# Patient Record
Sex: Female | Born: 1955
Health system: Southern US, Community
[De-identification: ages and names within clinical notes are randomized; demographics above are authoritative.]

## PROBLEM LIST (undated history)

## (undated) DIAGNOSIS — M1990 Unspecified osteoarthritis, unspecified site: Secondary | ICD-10-CM

## (undated) HISTORY — PX: NO PAST SURGERIES: SHX2092

---

## 1997-12-30 ENCOUNTER — Ambulatory Visit (HOSPITAL_COMMUNITY): Admission: RE | Admit: 1997-12-30 | Discharge: 1997-12-30 | Payer: Self-pay | Admitting: Family Medicine

## 1998-10-15 ENCOUNTER — Other Ambulatory Visit: Admission: RE | Admit: 1998-10-15 | Discharge: 1998-10-15 | Payer: Self-pay | Admitting: Family Medicine

## 2001-04-29 ENCOUNTER — Emergency Department (HOSPITAL_COMMUNITY): Admission: EM | Admit: 2001-04-29 | Discharge: 2001-04-29 | Payer: Self-pay | Admitting: Podiatry

## 2001-04-29 ENCOUNTER — Encounter: Payer: Self-pay | Admitting: *Deleted

## 2002-01-16 ENCOUNTER — Ambulatory Visit (HOSPITAL_COMMUNITY): Admission: RE | Admit: 2002-01-16 | Discharge: 2002-01-16 | Payer: Self-pay | Admitting: Ophthalmology

## 2008-07-10 ENCOUNTER — Emergency Department (HOSPITAL_COMMUNITY): Admission: EM | Admit: 2008-07-10 | Discharge: 2008-07-10 | Payer: Self-pay | Admitting: Family Medicine

## 2009-04-14 ENCOUNTER — Emergency Department (HOSPITAL_COMMUNITY): Admission: EM | Admit: 2009-04-14 | Discharge: 2009-04-14 | Payer: Self-pay | Admitting: Family Medicine

## 2010-07-24 ENCOUNTER — Emergency Department (HOSPITAL_COMMUNITY)
Admission: EM | Admit: 2010-07-24 | Discharge: 2010-07-24 | Disposition: A | Discharge status: Other Acute Inpatient Hospital | Payer: Self-pay | Source: Home / Self Care | Admitting: Family Medicine

## 2010-07-24 ENCOUNTER — Emergency Department (HOSPITAL_COMMUNITY)
Admission: EM | Admit: 2010-07-24 | Discharge: 2010-07-25 | Payer: Self-pay | Source: Home / Self Care | Admitting: Emergency Medicine

## 2010-08-03 LAB — URINALYSIS, ROUTINE W REFLEX MICROSCOPIC
Ketones, ur: 15 mg/dL — AB
Leukocytes, UA: NEGATIVE
Nitrite: NEGATIVE
Protein, ur: 30 mg/dL — AB
Specific Gravity, Urine: 1.031 — ABNORMAL HIGH (ref 1.005–1.030)
Urine Glucose, Fasting: NEGATIVE mg/dL
Urobilinogen, UA: 1 mg/dL (ref 0.0–1.0)
pH: 5.5 (ref 5.0–8.0)

## 2010-08-03 LAB — URINE MICROSCOPIC-ADD ON

## 2010-10-23 LAB — POCT RAPID STREP A (OFFICE): Streptococcus, Group A Screen (Direct): NEGATIVE

## 2010-11-13 ENCOUNTER — Other Ambulatory Visit: Payer: Self-pay | Admitting: Gynecology

## 2010-11-13 ENCOUNTER — Encounter (INDEPENDENT_AMBULATORY_CARE_PROVIDER_SITE_OTHER): Payer: BC Managed Care – PPO | Admitting: Gynecology

## 2010-11-13 ENCOUNTER — Other Ambulatory Visit (HOSPITAL_COMMUNITY)
Admission: RE | Admit: 2010-11-13 | Discharge: 2010-11-13 | Disposition: A | Discharge status: Home / Self Care | Payer: BC Managed Care – PPO | Source: Ambulatory Visit | Attending: Gynecology | Admitting: Gynecology

## 2010-11-13 DIAGNOSIS — R17 Unspecified jaundice: Secondary | ICD-10-CM

## 2010-11-13 DIAGNOSIS — Z124 Encounter for screening for malignant neoplasm of cervix: Secondary | ICD-10-CM | POA: Insufficient documentation

## 2010-11-13 DIAGNOSIS — Z01419 Encounter for gynecological examination (general) (routine) without abnormal findings: Secondary | ICD-10-CM

## 2010-11-13 DIAGNOSIS — Z13220 Encounter for screening for lipoid disorders: Secondary | ICD-10-CM

## 2010-11-16 ENCOUNTER — Other Ambulatory Visit: Payer: Self-pay | Admitting: Gynecology

## 2010-11-16 DIAGNOSIS — Z1231 Encounter for screening mammogram for malignant neoplasm of breast: Secondary | ICD-10-CM

## 2010-12-04 ENCOUNTER — Ambulatory Visit
Admission: RE | Admit: 2010-12-04 | Discharge: 2010-12-04 | Disposition: A | Discharge status: Home / Self Care | Payer: BC Managed Care – PPO | Source: Ambulatory Visit | Attending: Gynecology | Admitting: Gynecology

## 2010-12-04 DIAGNOSIS — Z1231 Encounter for screening mammogram for malignant neoplasm of breast: Secondary | ICD-10-CM

## 2012-01-14 ENCOUNTER — Ambulatory Visit (INDEPENDENT_AMBULATORY_CARE_PROVIDER_SITE_OTHER): Payer: BC Managed Care – PPO | Admitting: Emergency Medicine

## 2012-01-14 VITALS — BP 128/83 | HR 72 | Temp 98.4°F | Resp 18 | Ht <= 58 in | Wt 116.0 lb

## 2012-01-14 DIAGNOSIS — A088 Other specified intestinal infections: Secondary | ICD-10-CM

## 2012-01-14 MED ORDER — LOPERAMIDE HCL 2 MG PO TABS: 2.0000 mg | ORAL_TABLET | Freq: Four times a day (QID) | ORAL | Status: AC | PRN

## 2012-01-14 MED ORDER — ONDANSETRON 4 MG PO TBDP: 4.0000 mg | ORAL_TABLET | Freq: Three times a day (TID) | ORAL | Status: AC | PRN

## 2012-01-14 NOTE — Patient Instructions (Signed)
Viral Gastroenteritis Viral gastroenteritis is also known as stomach flu. This condition affects the stomach and intestinal tract. It can cause sudden diarrhea and vomiting. The illness typically lasts 3 to 8 days. Most people develop an immune response that eventually gets rid of the virus. While this natural response develops, the virus can make you quite ill. CAUSES  Many different viruses can cause gastroenteritis, such as rotavirus or noroviruses. You can catch one of these viruses by consuming contaminated food or water. You may also catch a virus by sharing utensils or other personal items with an infected person or by touching a contaminated surface. SYMPTOMS  The most common symptoms are diarrhea and vomiting. These problems can cause a severe loss of body fluids (dehydration) and a body salt (electrolyte) imbalance. Other symptoms may include:  Fever.   Headache.   Fatigue.   Abdominal pain.  DIAGNOSIS  Your caregiver can usually diagnose viral gastroenteritis based on your symptoms and a physical exam. A stool sample may also be taken to test for the presence of viruses or other infections. TREATMENT  This illness typically goes away on its own. Treatments are aimed at rehydration. The most serious cases of viral gastroenteritis involve vomiting so severely that you are not able to keep fluids down. In these cases, fluids must be given through an intravenous line (IV). HOME CARE INSTRUCTIONS   Drink enough fluids to keep your urine clear or pale yellow. Drink small amounts of fluids frequently and increase the amounts as tolerated.   Ask your caregiver for specific rehydration instructions.   Avoid:   Foods high in sugar.   Alcohol.   Carbonated drinks.   Tobacco.   Juice.   Caffeine drinks.   Extremely hot or cold fluids.   Fatty, greasy foods.   Too much intake of anything at one time.   Dairy products until 24 to 48 hours after diarrhea stops.   You may  consume probiotics. Probiotics are active cultures of beneficial bacteria. They may lessen the amount and number of diarrheal stools in adults. Probiotics can be found in yogurt with active cultures and in supplements.   Wash your hands well to avoid spreading the virus.   Only take over-the-counter or prescription medicines for pain, discomfort, or fever as directed by your caregiver. Do not give aspirin to children. Antidiarrheal medicines are not recommended.   Ask your caregiver if you should continue to take your regular prescribed and over-the-counter medicines.   Keep all follow-up appointments as directed by your caregiver.  SEEK IMMEDIATE MEDICAL CARE IF:   You are unable to keep fluids down.   You do not urinate at least once every 6 to 8 hours.   You develop shortness of breath.   You notice blood in your stool or vomit. This may look like coffee grounds.   You have abdominal pain that increases or is concentrated in one small area (localized).   You have persistent vomiting or diarrhea.   You have a fever.   The patient is a child younger than 3 months, and he or she has a fever.   The patient is a child older than 3 months, and he or she has a fever and persistent symptoms.   The patient is a child older than 3 months, and he or she has a fever and symptoms suddenly get worse.   The patient is a baby, and he or she has no tears when crying.  MAKE SURE YOU:     Understand these instructions.   Will watch your condition.   Will get help right away if you are not doing well or get worse.  Document Released: 07/05/2005 Document Revised: 06/24/2011 Document Reviewed: 04/21/2011 [employer] Patient Information 2012 [employer]. 

## 2012-01-14 NOTE — Progress Notes (Signed)
  Subjective:    Patient ID: Mandy Williams, female    DOB: 12/15/1955, 56 y.o.   MRN: [id number]  Diarrhea  This is a new problem. The current episode started yesterday. The problem occurs 5 to 10 times per day. The problem has been unchanged. The stool consistency is described as watery. The patient states that diarrhea does not awaken her from sleep. Associated symptoms include chills, a fever and vomiting. Pertinent negatives include no abdominal pain, arthralgias, bloating, coughing, headaches, increased  flatus, myalgias, sweats, URI or weight loss. Risk factors include no known risk factors. She has tried nothing for the symptoms. There is no history of bowel resection, inflammatory bowel disease, irritable bowel syndrome, malabsorption, a recent abdominal surgery or short gut syndrome.      Review of Systems  Constitutional: Positive for fever and chills. Negative for weight loss.  HENT: Negative.   Eyes: Negative.   Respiratory: Negative.  Negative for cough.   Cardiovascular: Negative.   Gastrointestinal: Positive for nausea, vomiting and diarrhea. Negative for abdominal pain, abdominal distention, bloating and flatus.  Genitourinary: Negative.   Musculoskeletal: Negative.  Negative for myalgias and arthralgias.  Skin: Negative.   Neurological: Negative for headaches.       Objective:   Physical Exam  Constitutional: She is oriented to person, place, and time. She appears well-developed and well-nourished.  HENT:  Head: Normocephalic and atraumatic.  Right Ear: External ear normal.  Left Ear: External ear normal.  Eyes: Conjunctivae are normal. Pupils are equal, round, and reactive to light. No scleral icterus.  Neck: Normal range of motion. Neck supple.  Cardiovascular: Normal rate, regular rhythm, normal heart sounds and intact distal pulses.   Pulmonary/Chest: Effort normal and breath sounds normal.  Abdominal: Soft.  Musculoskeletal: Normal range of motion.  Neurological:  She is alert and oriented to person, place, and time.  Skin: Skin is warm and dry.          Assessment & Plan:  Imodium, zofran  Clear liquids   Tylenol for fever  Follow up or ER for new or worsened symptoms

## 2012-04-18 ENCOUNTER — Ambulatory Visit: Payer: BC Managed Care – PPO

## 2012-04-18 ENCOUNTER — Ambulatory Visit (INDEPENDENT_AMBULATORY_CARE_PROVIDER_SITE_OTHER): Payer: BC Managed Care – PPO | Admitting: Family Medicine

## 2012-04-18 VITALS — BP 144/80 | HR 72 | Temp 97.8°F | Resp 16 | Ht <= 58 in | Wt 115.0 lb

## 2012-04-18 DIAGNOSIS — M79643 Pain in unspecified hand: Secondary | ICD-10-CM

## 2012-04-18 DIAGNOSIS — M7710 Lateral epicondylitis, unspecified elbow: Secondary | ICD-10-CM

## 2012-04-18 DIAGNOSIS — M549 Dorsalgia, unspecified: Secondary | ICD-10-CM

## 2012-04-18 MED ORDER — DICLOFENAC SODIUM 75 MG PO TBEC: 75.0000 mg | DELAYED_RELEASE_TABLET | Freq: Two times a day (BID) | ORAL | Status: DC

## 2012-04-18 NOTE — Progress Notes (Signed)
Mandy Williams is a 56 y.o. female who presents to [facility] today for bilateral hand pain present for several days without injury. Patient has pain at the left second MCP and right fourth PIP. Additionally pain in the right elbow. No change in work. Patient works in a Development worker, community with frequent repetitive hand activity. She feels well otherwise without any radiating pain weakness numbness loss of sensation. She has never had anything like this previously. She feels well otherwise.   PMH: Reviewed otherwise healthy History  Substance Use Topics  . Smoking status: Never Smoker   . Smokeless tobacco: Not on file  . Alcohol Use: Not on file   ROS as above  Medications reviewed. Current Outpatient Prescriptions  Medication Sig Dispense Refill  . diclofenac (VOLTAREN) 75 MG EC tablet Take 1 tablet (75 mg total) by mouth 2 (two) times daily.  60 tablet  0    Exam:  BP 144/80  Pulse 72  Temp 97.8 F (36.6 C) (Oral)  Resp 16  Ht 4\' 7"  (1.397 m)  Wt 115 lb (52.164 kg)  BMI 26.73 kg/m2 Gen: Well NAD LEFT HAND: Normal-appearing. Mild synovitis of left second MCP. Reduced flexion range of motion normal extension and strength. Right hand normal-appearing mildly tender at the right fourth PIP. Normal range of motion and strength Sensation is intact throughout capillary refill is intact throughout pulses are 2+ at both wrists Right elbow: Normal range of motion normal. Mildly tender at right lateral epicondyles. Pain at right lateral epicondyle with resisted wrist extension  Three-view of right hand. Normal-appearing no bony abnormalities normal alignment Review of left hand: Normal-appearing no bony abnormalities normal alignment  Assessment and Plan: 56 y.o. female with bilateral hand pain. No definitive diagnosis tonight possibly synovitis secondary to overuse. Additionally patient has right lateral epicondylitis.   Plan: Diclofenac twice daily as needed. Out of work x1-3 days. Return to clinic if  not improving. Patient expresses understanding.

## 2012-04-18 NOTE — Patient Instructions (Addendum)
Thank you for coming in today. Take the medicine twice a day for one week. Out of work for 1-3 days as needed Come back if not better

## 2012-06-05 ENCOUNTER — Encounter: Payer: Self-pay | Admitting: Family Medicine

## 2012-06-05 ENCOUNTER — Ambulatory Visit: Payer: BC Managed Care – PPO

## 2012-06-05 ENCOUNTER — Ambulatory Visit (INDEPENDENT_AMBULATORY_CARE_PROVIDER_SITE_OTHER): Payer: BC Managed Care – PPO | Admitting: Family Medicine

## 2012-06-05 VITALS — BP 120/68 | HR 98 | Temp 98.0°F | Resp 16 | Ht <= 58 in | Wt 113.2 lb

## 2012-06-05 DIAGNOSIS — R0989 Other specified symptoms and signs involving the circulatory and respiratory systems: Secondary | ICD-10-CM

## 2012-06-05 DIAGNOSIS — J019 Acute sinusitis, unspecified: Secondary | ICD-10-CM

## 2012-06-05 DIAGNOSIS — J1100 Influenza due to unidentified influenza virus with unspecified type of pneumonia: Secondary | ICD-10-CM

## 2012-06-05 DIAGNOSIS — R6889 Other general symptoms and signs: Secondary | ICD-10-CM

## 2012-06-05 DIAGNOSIS — R05 Cough: Secondary | ICD-10-CM

## 2012-06-05 DIAGNOSIS — R069 Unspecified abnormalities of breathing: Secondary | ICD-10-CM

## 2012-06-05 DIAGNOSIS — R059 Cough, unspecified: Secondary | ICD-10-CM

## 2012-06-05 LAB — POCT INFLUENZA A/B
Influenza A, POC: NEGATIVE
Influenza B, POC: NEGATIVE

## 2012-06-05 MED ORDER — AZITHROMYCIN 250 MG PO TABS: ORAL_TABLET | ORAL | Status: DC

## 2012-06-05 MED ORDER — HYDROCODONE-HOMATROPINE 5-1.5 MG/5ML PO SYRP: 5.0000 mL | ORAL_SOLUTION | Freq: Every evening | ORAL | Status: DC | PRN

## 2012-06-05 MED ORDER — BENZONATATE 100 MG PO CAPS: 200.0000 mg | ORAL_CAPSULE | Freq: Two times a day (BID) | ORAL | Status: AC | PRN

## 2012-06-05 NOTE — Progress Notes (Signed)
  [facility]:  Office Visit  Chief Complaint:  Chief Complaint  Patient presents with  . Sore Throat    nasal congestion   . Cough    productive symptoms since Saturday    HPI: Mandy Williams is a 56 y.o. female who complains of  3 day history of generalized muscle aches, subjective fevers, cough, green sputum production. No sick contacts, has not had the flu vaccine, no sick contacts. + Chest congestion and pressure with deep breaths. Language barrier due to speaks [place] dialect.  History reviewed. No pertinent past medical history. History reviewed. No pertinent past surgical history. History   Social History  . Marital Status: Married    Spouse Name: N/A    Number of Children: N/A  . Years of Education: N/A   Social History Main Topics  . Smoking status: Never Smoker   . Smokeless tobacco: None  . Alcohol Use: No  . Drug Use: No  . Sexually Active: None   Other Topics Concern  . None   Social History Narrative  . None   No family history on file. No Known Allergies Prior to Admission medications   Medication Sig Start Date End Date Taking? Authorizing Provider  diclofenac (VOLTAREN) 75 MG EC tablet Take 1 tablet (75 mg total) by mouth 2 (two) times daily. 04/18/12   Rodolph Bong, MD     ROS: The patient denies fevers, chills, night sweats, unintentional weight loss,  palpitations, wheezing, dyspnea on exertion, nausea, vomiting, abdominal pain, dysuria, hematuria, melena, numbness, weakness, or tingling.   All other systems have been reviewed and were otherwise negative with the exception of those mentioned in the HPI and as above.    PHYSICAL EXAM: Filed Vitals:   06/05/12 1151  BP: 120/68  Pulse: 98  Temp: 98 F (36.7 C)  Resp: 16   Filed Vitals:   06/05/12 1151  Height: 4\' 7"  (1.397 m)  Weight: 113 lb 3.2 oz (51.347 kg)   Body mass index is 26.31 kg/(m^2).  General: Alert, no acute distress HEENT:  Normocephalic, atraumatic,  oropharynx patent. EOMI, PERRLA, TM nl. + Sinus tendernesss Cardiovascular:  Regular rate and rhythm, no rubs murmurs or gallops.  No Carotid bruits, radial pulse intact. No pedal edema.  Respiratory: Clear to auscultation bilaterally.  No wheezes, rales, or rhonchi.  No cyanosis, no use of accessory musculature GI: No organomegaly, abdomen is soft and non-tender, positive bowel sounds.  No masses. Skin: No rashes. Neurologic: Facial musculature symmetric. Psychiatric: Patient is appropriate throughout our interaction. Lymphatic: No cervical lymphadenopathy Musculoskeletal: Gait intact.   LABS: Results for orders placed in visit on 06/05/12  POCT INFLUENZA A/B      Component Value Range   Influenza A, POC Negative     Influenza B, POC Negative       EKG/XRAY:   Primary read interpreted by Dr. Conley Rolls at [facility] No acute cardiopulmonary process   ASSESSMENT/PLAN: Encounter Diagnoses  Name Primary?  . Flu-like symptoms Yes  . Chest congestion   . Cough   . Acute sinusitis    Rx cough meds, tessalon perles and hydromet, vicks vapor rub, z pack prn if sxs do not improve. Work note given for 11/18-11/19. Return to work on 06/07/12 F/u prn   Mandy Angus PHUONG, DO 06/05/2012 3:28 PM

## 2012-06-05 NOTE — Patient Instructions (Signed)

## 2012-12-25 ENCOUNTER — Ambulatory Visit (INDEPENDENT_AMBULATORY_CARE_PROVIDER_SITE_OTHER): Payer: BC Managed Care – PPO | Admitting: Family Medicine

## 2012-12-25 ENCOUNTER — Ambulatory Visit: Payer: BC Managed Care – PPO

## 2012-12-25 ENCOUNTER — Encounter: Payer: Self-pay | Admitting: Family Medicine

## 2012-12-25 VITALS — BP 146/85 | HR 86 | Temp 97.8°F | Resp 16 | Ht <= 58 in | Wt 114.0 lb

## 2012-12-25 DIAGNOSIS — R059 Cough, unspecified: Secondary | ICD-10-CM

## 2012-12-25 DIAGNOSIS — R05 Cough: Secondary | ICD-10-CM

## 2012-12-25 DIAGNOSIS — J209 Acute bronchitis, unspecified: Secondary | ICD-10-CM

## 2012-12-25 DIAGNOSIS — J019 Acute sinusitis, unspecified: Secondary | ICD-10-CM

## 2012-12-25 LAB — POCT CBC
Granulocyte percent: 60.5 %G (ref 37–80)
HCT, POC: 39 % (ref 37.7–47.9)
Hemoglobin: 11.7 g/dL — AB (ref 12.2–16.2)
Lymph, poc: 2.3 (ref 0.6–3.4)
MCH, POC: 22.3 pg — AB (ref 27–31.2)
MCHC: 30 g/dL — AB (ref 31.8–35.4)
MCV: 74.4 fL — AB (ref 80–97)
MID (cbc): 0.6 (ref 0–0.9)
MPV: 8.9 fL (ref 0–99.8)
POC Granulocyte: 4.5 (ref 2–6.9)
POC LYMPH PERCENT: 31.1 %L (ref 10–50)
POC MID %: 8.4 %M (ref 0–12)
Platelet Count, POC: 238 10*3/uL (ref 142–424)
RBC: 5.24 M/uL (ref 4.04–5.48)
RDW, POC: 15.1 %
WBC: 7.5 10*3/uL (ref 4.6–10.2)

## 2012-12-25 MED ORDER — PREDNISONE 20 MG PO TABS: ORAL_TABLET | ORAL | Status: DC

## 2012-12-25 MED ORDER — HYDROCODONE-HOMATROPINE 5-1.5 MG/5ML PO SYRP: 5.0000 mL | ORAL_SOLUTION | Freq: Every evening | ORAL | Status: DC | PRN

## 2012-12-25 MED ORDER — AZITHROMYCIN 250 MG PO TABS: ORAL_TABLET | ORAL | Status: DC

## 2012-12-25 NOTE — Patient Instructions (Addendum)
Return in 2 days for recheck.  No work until Thursday.

## 2012-12-25 NOTE — Progress Notes (Signed)
  Subjective:    Patient ID: Mandy Williams, female    DOB: 1955-08-12, 57 y.o.   MRN: [id number]  HPI 56 YO female patient comes in today with her daughter to translate. Patient complains of a headache, fever, sore throat, and a cough. The cough is productive with bloody, yellow mucus. She still feels like something is stuck inside her chest. She will cough until it is bloodier. She had the chills earlier today. She gets short of breath while coughing but otherwise she is not having any difficulty breathing. This has been going on since Saturday.   She does not have a history of asthma. She did have pneumonia in the past. She is not a smoker. 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   Review of Systems     Objective:   Physical Exam        Assessment & Plan:

## 2012-12-25 NOTE — Progress Notes (Signed)
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  She has had two days of cough, violent at times, with possible fever and hemoptysis. Non smoker, no asthma.  Positive h/o pneumonia  No night sweats, weight loss or neck swelling  Objective: NAD Chest:  Few ronchi bilaterally Heart:  Reg, no murmur Ext:  No edema Neck:  Supple, no adenopathy Oroph:  Clear with multiple missing teeth and receding gums Skin: warm and dry.l\  [facility] reading (PRIMARY) by  Dr. Milus Glazier CXR:  Hazy LLL with no silouette sign  Results for orders placed in visit on 12/25/12  POCT CBC      Result Value Range   WBC 7.5  4.6 - 10.2 K/uL   Lymph, poc 2.3  0.6 - 3.4   POC LYMPH PERCENT 31.1  10 - 50 %L   MID (cbc) 0.6  0 - 0.9   POC MID % 8.4  0 - 12 %M   POC Granulocyte 4.5  2 - 6.9   Granulocyte percent 60.5  37 - 80 %G   RBC 5.24  4.04 - 5.48 M/uL   Hemoglobin 11.7 (*) 12.2 - 16.2 g/dL   HCT, POC 40.9  81.1 - 47.9 %   MCV 74.4 (*) 80 - 97 fL   MCH, POC 22.3 (*) 27 - 31.2 pg   MCHC 30.0 (*) 31.8 - 35.4 g/dL   RDW, POC 91.4     Platelet Count, POC 238  142 - 424 K/uL   MPV 8.9  0 - 99.8 fL   Assessment:  Bronchitis with RAD  Cough - Plan: POCT CBC, DG Chest 2 View, azithromycin (ZITHROMAX) 250 MG tablet, HYDROcodone-homatropine (HYCODAN) 5-1.5 MG/5ML syrup, predniSONE (DELTASONE) 20 MG tablet  Acute sinusitis  Recheck in 48 hours.

## 2012-12-27 ENCOUNTER — Ambulatory Visit (INDEPENDENT_AMBULATORY_CARE_PROVIDER_SITE_OTHER): Payer: BC Managed Care – PPO | Admitting: Family Medicine

## 2012-12-27 VITALS — BP 118/66 | HR 78 | Temp 97.8°F | Resp 18 | Ht <= 58 in | Wt 113.4 lb

## 2012-12-27 DIAGNOSIS — R05 Cough: Secondary | ICD-10-CM

## 2012-12-27 DIAGNOSIS — J019 Acute sinusitis, unspecified: Secondary | ICD-10-CM

## 2012-12-27 DIAGNOSIS — R059 Cough, unspecified: Secondary | ICD-10-CM

## 2012-12-27 MED ORDER — BENZONATATE 100 MG PO CAPS: 200.0000 mg | ORAL_CAPSULE | Freq: Two times a day (BID) | ORAL | Status: DC | PRN

## 2012-12-27 MED ORDER — HYDROCODONE-HOMATROPINE 5-1.5 MG/5ML PO SYRP: 5.0000 mL | ORAL_SOLUTION | Freq: Every evening | ORAL | Status: DC | PRN

## 2012-12-27 MED ORDER — AMOXICILLIN-POT CLAVULANATE 875-125 MG PO TABS: 1.0000 | ORAL_TABLET | Freq: Two times a day (BID) | ORAL | Status: DC

## 2012-12-27 NOTE — Progress Notes (Signed)
[facility]:  Office Visit  Chief Complaint:  Chief Complaint  Patient presents with  . Follow-up    still having cough, fatigue     HPI: Mandy Williams is a 57 y.o. female who complains of  Here for f/u and not feeling better since evaluated for acute sinusitis. She has chills. She has cough.  The cough medicine did help, antibiotic did not help but makes her body itchy and also she feels nauseated and dizzy. Eating and drinking fair. She is here with her neighbor.  She has been at work. She was given predniosone, z pack and hydromet syrup. Last cxr was done on OV and showed no acute cardiopulmoanry process Nonsmoker, no allergies, works in Industrial/product designer   History reviewed. No pertinent past medical history. History reviewed. No pertinent past surgical history. History   Social History  . Marital Status: Married    Spouse Name: N/A    Number of Children: N/A  . Years of Education: N/A   Social History Main Topics  . Smoking status: Never Smoker   . Smokeless tobacco: None  . Alcohol Use: No  . Drug Use: No  . Sexually Active: None   Other Topics Concern  . None   Social History Narrative  . None   History reviewed. No pertinent family history. No Known Allergies Prior to Admission medications   Medication Sig Start Date End Date Taking? Authorizing Provider  azithromycin (ZITHROMAX) 250 MG tablet Take 2 tabs po now then 1 tab po daily for the next 4 days 12/25/12  Yes Elvina Sidle, MD  HYDROcodone-homatropine [facility]) 5-1.5 MG/5ML syrup Take 5 mLs by mouth at bedtime as needed for cough. 12/25/12  Yes Elvina Sidle, MD  predniSONE (DELTASONE) 20 MG tablet 2 daily with food 12/25/12  Yes Elvina Sidle, MD     ROS: The patient denies night sweats, unintentional weight loss, chest pain, palpitations, wheezing, dyspnea on exertion, nausea, vomiting, abdominal pain, dysuria, hematuria, melena, numbness, or tingling.   All other systems have been  reviewed and were otherwise negative with the exception of those mentioned in the HPI and as above.    PHYSICAL EXAM: Filed Vitals:   12/27/12 0918  BP: 118/66  Pulse: 78  Temp: 97.8 F (36.6 C)  Resp: 18   Filed Vitals:   12/27/12 0918  Height: 4\' 7"  (1.397 m)  Weight: 113 lb 6.4 oz (51.438 kg)  Spo2  98% Body mass index is 26.36 kg/(m^2).  General: Alert, no acute distress HEENT:  Normocephalic, atraumatic, oropharynx patent. Tm nl. No exudates. + sinus tenderness Cardiovascular:  Regular rate and rhythm, no rubs murmurs or gallops.  No Carotid bruits, radial pulse intact. No pedal edema.  Respiratory: Clear to auscultation bilaterally.  No wheezes, rales, or rhonchi.  No cyanosis, no use of accessory musculature GI: No organomegaly, abdomen is soft and non-tender, positive bowel sounds.  No masses. Skin: No rashes. Neurologic: Facial musculature symmetric. Psychiatric: Patient is appropriate throughout our interaction. Lymphatic: No cervical lymphadenopathy Musculoskeletal: Gait intact.   LABS: Results for orders placed in visit on 12/25/12  POCT CBC      Result Value Range   WBC 7.5  4.6 - 10.2 K/uL   Lymph, poc 2.3  0.6 - 3.4   POC LYMPH PERCENT 31.1  10 - 50 %L   MID (cbc) 0.6  0 - 0.9   POC MID % 8.4  0 - 12 %M   POC Granulocyte 4.5  2 -  6.9   Granulocyte percent 60.5  37 - 80 %G   RBC 5.24  4.04 - 5.48 M/uL   Hemoglobin 11.7 (*) 12.2 - 16.2 g/dL   HCT, POC 16.1  09.6 - 47.9 %   MCV 74.4 (*) 80 - 97 fL   MCH, POC 22.3 (*) 27 - 31.2 pg   MCHC 30.0 (*) 31.8 - 35.4 g/dL   RDW, POC 04.5     Platelet Count, POC 238  142 - 424 K/uL   MPV 8.9  0 - 99.8 fL     EKG/XRAY:   Primary read interpreted by Dr. Conley Rolls at [facility].   ASSESSMENT/PLAN: Encounter Diagnoses  Name Primary?  . Acute sinusitis Yes  . Cough    Will change her abx to Augmentin, Tessalon PErles, refill hydromet No xrays needed last one on 12/25/12 was normal, lungs are clear DC prednisone, Dc  Azithromycin F/u prn   Enis Riecke PHUONG, DO 12/28/2012 7:23 AM

## 2012-12-27 NOTE — Patient Instructions (Signed)
Sinusitis Sinusitis is redness, soreness, and swelling (inflammation) of the paranasal sinuses. Paranasal sinuses are air pockets within the bones of your face (beneath the eyes, the middle of the forehead, or above the eyes). In healthy paranasal sinuses, mucus is able to drain out, and air is able to circulate through them by way of your nose. However, when your paranasal sinuses are inflamed, mucus and air can become trapped. This can allow bacteria and other germs to grow and cause infection. Sinusitis can develop quickly and last only a short time (acute) or continue over a long period (chronic). Sinusitis that lasts for more than 12 weeks is considered chronic.  CAUSES  Causes of sinusitis include:  Allergies.  Structural abnormalities, such as displacement of the cartilage that separates your nostrils (deviated septum), which can decrease the air flow through your nose and sinuses and affect sinus drainage.  Functional abnormalities, such as when the small hairs (cilia) that line your sinuses and help remove mucus do not work properly or are not present. SYMPTOMS  Symptoms of acute and chronic sinusitis are the same. The primary symptoms are pain and pressure around the affected sinuses. Other symptoms include:  Upper toothache.  Earache.  Headache.  Bad breath.  Decreased sense of smell and taste.  A cough, which worsens when you are lying flat.  Fatigue.  Fever.  Thick drainage from your nose, which often is green and may contain pus (purulent).  Swelling and warmth over the affected sinuses. DIAGNOSIS  Your caregiver will perform a physical exam. During the exam, your caregiver may:  Look in your nose for signs of abnormal growths in your nostrils (nasal polyps).  Tap over the affected sinus to check for signs of infection.  View the inside of your sinuses (endoscopy) with a special imaging device with a light attached (endoscope), which is inserted into your  sinuses. If your caregiver suspects that you have chronic sinusitis, one or more of the following tests may be recommended:  Allergy tests.  Nasal culture A sample of mucus is taken from your nose and sent to a lab and screened for bacteria.  Nasal cytology A sample of mucus is taken from your nose and examined by your caregiver to determine if your sinusitis is related to an allergy. TREATMENT  Most cases of acute sinusitis are related to a viral infection and will resolve on their own within 10 days. Sometimes medicines are prescribed to help relieve symptoms (pain medicine, decongestants, nasal steroid sprays, or saline sprays).  However, for sinusitis related to a bacterial infection, your caregiver will prescribe antibiotic medicines. These are medicines that will help kill the bacteria causing the infection.  Rarely, sinusitis is caused by a fungal infection. In theses cases, your caregiver will prescribe antifungal medicine. For some cases of chronic sinusitis, surgery is needed. Generally, these are cases in which sinusitis recurs more than 3 times per year, despite other treatments. HOME CARE INSTRUCTIONS   Drink plenty of water. Water helps thin the mucus so your sinuses can drain more easily.  Use a humidifier.  Inhale steam 3 to 4 times a day (for example, sit in the bathroom with the shower running).  Apply a warm, moist washcloth to your face 3 to 4 times a day, or as directed by your caregiver.  Use saline nasal sprays to help moisten and clean your sinuses.  Take over-the-counter or prescription medicines for pain, discomfort, or fever only as directed by your caregiver. SEEK IMMEDIATE MEDICAL   CARE IF:  You have increasing pain or severe headaches.  You have nausea, vomiting, or drowsiness.  You have swelling around your face.  You have vision problems.  You have a stiff neck.  You have difficulty breathing. MAKE SURE YOU:   Understand these  instructions.  Will watch your condition.  Will get help right away if you are not doing well or get worse. Document Released: 07/05/2005 Document Revised: 09/27/2011 Document Reviewed: 07/20/2011 [facility] Patient Information 2014 [place], [place]. Vim xoang  (Sinusitis) Vim xoang l hi?n t??ng t?y ??, ?au nh?c v s?ng (vim) ? cc xoang c?nh m?i. Xoang c?nh m?i l cc ti kh trong x??ng c?a m?t (bn d??i m?t, gi?a trn ho?c trn m?t). Trong cc xoang c?nh m?i kh?e m?nh, d?ch nh?y c th? thot ra ngoi v khng kh c th? l?u thng qua chng theo ???ng m?i. Tuy nhin, khi cc xoang c?nh m?i b? vim, d?ch nh?y v khng kh c th? b? m?c k?t. ?i?u ny c th? cho php vi khu?n v vi trng khc pht tri?n v gy nhi?m trng.   Vim xoang c th? pht tri?n m?t cch nhanh chng v ko di trong m?t th?i gian ng?n (c?p tnh) ho?c ti?p t?c trong th?i gian di (mn tnh). Vim xoang ko di h?n 12 tu?n ???c coi l mn tnh.  NGUYN NHN  Nguyn nhn vim xoang bao g?m:   D? ?ng.  Di d?ng k?t c?u, ch?ng h?n nh? d?ch chuy?n c?a s?n phn cch l? m?i (l?ch vch ng?n), c th? lm gi?m lu?ng khng kh qua m?i c?ng nh? cc xoang v ?nh h??ng ??n kh? n?ng thot c?a xoang.  D? d?ng ch?c n?ng, ch?ng h?n nh? khi cc s?i lng nh? (mao) ph? cc xoang v gip lo?i b? d?ch nh?y khng ho?t ??ng ?ng ho?c khng c. TRI?U CH?NG  Cc tri?u ch?ng c?a vim xoang c?p tnh v mn tnh ??u gi?ng nhau. Cc tri?u ch?ng chnh l ?au v p l?c xung quanh xoang b? ?nh h??ng. Cc tri?u ch?ng khc bao g?m:   ?au r?ng trn.  ?au tai.  ?au ??u.  H?i th? hi.  Suy gi?m thnh gic v v? gic.  Ho n?ng h?n khi n?m.  M?t m?i.  S?t.  R? d?ch ??c t? m?i, th??ng c mu xanh v c th? ch?a m?.  S?ng v ?m h?n ? cc xoang b? ?nh h??ng. CH?N ?ON  Chuyn gia ch?m [place] y t? s? khm tr?c ti?p. Trong qu trnh xt nghi?m, chuyn gia ch?m [place] y t? c th?:   Soi m?i c?a b?n xem c cc d?u hi?u c?a s? pht tri?n b?t th??ng trong  l? m?i (polyp m?i) khng.  G vo cc xoang b? ?nh h??ng ?? ki?m tra d?u hi?u nhi?m trng.  Xem bn trong cc xoang (n?i soi) b?ng m?t thi?t b? hnh ?nh ??c bi?t c g?n ?n (n?i soi) ???c ??a vo xoang. N?u chuyn gia ch?m [place] y t? nghi ng? r?ng b?n b? vim xoang mn tnh, m?t ho?c nhi?u xt nghi?m sau ?y c th? ???c ?? ngh?:   Xt nghi?m d? ?ng.  L?y m?u c?y m?i-M?t m?u d?ch nh?y ???c l?y t? m?i c?a b?n v g?i ??n phng th nghi?m ?? ki?m tra vi khu?n.  T? bo h?c m?i-M?t m?u d?ch nh?y ???c l?y t? m?i c?a b?n v xt nghi?m b?i chuyn gia ch?m [place] y t? ?? xc ??nh xem tnh tr?ng vim xoang c?a b?n c lin quan ??n  d? ?ng hay khng. ?I?U TR?  H?u h?t cc tr??ng h?p vim xoang c?p tnh c lin quan ??n nhi?m vi rt v s? t? kh?i trong vng 10 ngy. ?i khi thu?c ???c ch? ??nh ?? gip lm gi?m cc tri?u ch?ng (thu?c gi?m ?au, thu?c thng m?i, thu?c x?t m?i steroid ho?c bnh x?t n??c mu?i).  Tuy nhin, v?i vim xoang lin quan ??n nhi?m vi khu?n, chuyn gia ch?m [place] y t? s? k thu?c khng sinh. ?y l nh?ng lo?i thu?c s? gip tiu di?t vi khu?n gy nhi?m trng.  Trong tr??ng h?p hi?m g?p, vim xoang gy b?i nhi?m trng do n?m. Trong nh?ng tr??ng h?p ny, chuyn gia ch?m [place] y t? s? k thu?c khng n?m.  M?t s? tr??ng h?p vim xoang mn tnh s? c?n ph?u thu?t. Ni chung, ?y l nh?ng tr??ng h?p vim xoang ti pht trn 3 l?n m?i n?m, m?c d ? th?c hi?n cc ph??ng php ?i?u tr? khc.  H??NG D?N CH?M [place] T?I NH   U?ng th?t nhi?u n??c. N??c gip lm long d?ch nh?y ?? xoang c th? thot d? dng h?n.  S? d?ng my t?o ?m.  Ht h?i n??c 3 ??n 4 l?n m?t ngy (v d?, ng?i trong phng t?m v?i vi sen ?ang ch?y).  ??t kh?n ?m, ?m ln m?t 3 ??n 4 l?n m?t ngy, ho?c theo ch? d?n c?a chuyn gia ch?m [place] y t?.  S? d?ng bnh x?t m?i ch?a n??c mu?i ?? gip lm ?m v lm s?ch xoang.  Ch? s? d?ng thu?c mua tr?c ti?p t?i hi?u thu?c ho?c thu?c theo toa ?? gi?m ?au, gi?m s? kh ch?u ho?c h? s?t theo ch? d?n c?a  chuyn gia ch?m [place] y t? c?a b?n. HY NGAY L?P T?C THAM V?N V?I CHUYN GIA Y T? N?U:   B?n b? ?au gia t?ng ho?c ?au ??u n?ng.  B?n b? bu?n nn, nn m?a ho?c bu?n ng?.  B?n b? s?ng xung quanh m?t.  B?n c v?n ?? v? th? l?c.  B?n b? c?ng c?.  B?n b? kh th?. ??M B?O B?N:   Hi?u cc h??ng d?n ny.  S? theo di tnh tr?ng c?a mnh.  S? yu c?u tr? gip ngay l?p t?c n?u b?n c?m th?y khng kh?e ho?c tnh tr?ng tr? nn t?i h?n. Document Released: 01/04/2012 [facility] Patient Information 2014 [place], [place].

## 2013-04-27 ENCOUNTER — Other Ambulatory Visit (HOSPITAL_COMMUNITY)
Admission: RE | Admit: 2013-04-27 | Discharge: 2013-04-27 | Disposition: A | Discharge status: Home / Self Care | Payer: BC Managed Care – PPO | Source: Ambulatory Visit | Attending: Gynecology | Admitting: Gynecology

## 2013-04-27 ENCOUNTER — Ambulatory Visit (INDEPENDENT_AMBULATORY_CARE_PROVIDER_SITE_OTHER): Payer: BC Managed Care – PPO | Admitting: Gynecology

## 2013-04-27 ENCOUNTER — Encounter: Payer: Self-pay | Admitting: Gynecology

## 2013-04-27 VITALS — BP 112/66 | Ht <= 58 in | Wt 123.0 lb

## 2013-04-27 DIAGNOSIS — N952 Postmenopausal atrophic vaginitis: Secondary | ICD-10-CM

## 2013-04-27 DIAGNOSIS — E559 Vitamin D deficiency, unspecified: Secondary | ICD-10-CM

## 2013-04-27 DIAGNOSIS — Z01419 Encounter for gynecological examination (general) (routine) without abnormal findings: Secondary | ICD-10-CM

## 2013-04-27 DIAGNOSIS — Z13220 Encounter for screening for lipoid disorders: Secondary | ICD-10-CM

## 2013-04-27 LAB — CBC WITH DIFFERENTIAL/PLATELET
Basophils Absolute: 0 10*3/uL (ref 0.0–0.1)
Basophils Relative: 1 % (ref 0–1)
Eosinophils Absolute: 0.3 10*3/uL (ref 0.0–0.7)
Eosinophils Relative: 4 % (ref 0–5)
HCT: 36.6 % (ref 36.0–46.0)
Hemoglobin: 11.6 g/dL — ABNORMAL LOW (ref 12.0–15.0)
Lymphocytes Relative: 40 % (ref 12–46)
Lymphs Abs: 3.3 10*3/uL (ref 0.7–4.0)
MCH: 22.4 pg — ABNORMAL LOW (ref 26.0–34.0)
MCHC: 31.7 g/dL (ref 30.0–36.0)
MCV: 70.8 fL — ABNORMAL LOW (ref 78.0–100.0)
Monocytes Absolute: 0.6 10*3/uL (ref 0.1–1.0)
Monocytes Relative: 7 % (ref 3–12)
Neutro Abs: 4 10*3/uL (ref 1.7–7.7)
Neutrophils Relative %: 48 % (ref 43–77)
Platelets: 357 10*3/uL (ref 150–400)
RBC: 5.17 MIL/uL — ABNORMAL HIGH (ref 3.87–5.11)
RDW: 15.4 % (ref 11.5–15.5)
WBC: 8.3 10*3/uL (ref 4.0–10.5)

## 2013-04-27 LAB — COMPREHENSIVE METABOLIC PANEL
ALT: 16 U/L (ref 0–35)
AST: 13 U/L (ref 0–37)
Albumin: 3.8 g/dL (ref 3.5–5.2)
Alkaline Phosphatase: 72 U/L (ref 39–117)
BUN: 18 mg/dL (ref 6–23)
CO2: 27 mEq/L (ref 19–32)
Calcium: 9 mg/dL (ref 8.4–10.5)
Chloride: 103 mEq/L (ref 96–112)
Creat: 0.81 mg/dL (ref 0.50–1.10)
Glucose, Bld: 84 mg/dL (ref 70–99)
Potassium: 3.8 mEq/L (ref 3.5–5.3)
Sodium: 137 mEq/L (ref 135–145)
Total Bilirubin: 0.4 mg/dL (ref 0.3–1.2)
Total Protein: 7.4 g/dL (ref 6.0–8.3)

## 2013-04-27 LAB — LIPID PANEL
Cholesterol: 201 mg/dL — ABNORMAL HIGH (ref 0–200)
HDL: 54 mg/dL (ref >39)
LDL Cholesterol: 103 mg/dL — ABNORMAL HIGH (ref 0–99)
Total CHOL/HDL Ratio: 3.7 Ratio
Triglycerides: 218 mg/dL — ABNORMAL HIGH (ref <150)
VLDL: 44 mg/dL — ABNORMAL HIGH (ref 0–40)

## 2013-04-27 NOTE — Progress Notes (Signed)
Mandy Williams [birth year] [id number]        57 y.o.  [id number] for annual exam.  Doing well without complaints. Daughter is present to interpret.  Past medical history,surgical history, medications, allergies, family history and social history were all reviewed and documented in the EPIC chart.  ROS:  Performed and pertinent positives and negatives are included in the history, assessment and plan .  Exam: Kim assistant Filed Vitals:   04/27/13 1439  BP: 112/66  Height: 4\' 6"  (1.372 m)  Weight: 123 lb (55.792 kg)   General appearance  Normal Skin grossly normal Head/Neck normal with no cervical or supraclavicular adenopathy thyroid normal Lungs  clear Cardiac RR, without RMG Abdominal  soft, nontender, without masses, organomegaly or hernia Breasts  examined lying and sitting without masses, retractions, discharge or axillary adenopathy. Pelvic  Ext/BUS/vagina  normal with mild atrophic changes  Cervix  normal Pap done  Uterus  anteverted, normal size, shape and contour, midline and mobile nontender   Adnexa  Without masses or tenderness    Anus and perineum  normal   Rectovaginal  normal sphincter tone without palpated masses or tenderness.    Assessment/Plan:  57 y.o. [id number] female for annual exam.   1. Postmenopausal. No significant symptoms of hot flushes, night sweats, vaginal dryness or bleeding. Will continue to monitor. Patient knows to report any bleeding. 2. Pap smear 2012 normal. Pap repeated today given her lack of annual followup. No history of abnormal Pap smears previously. 3. Mammography 2012. Patient knows she is overdue and agrees to schedule. SBE monthly reviewed. 4. Colonoscopy never. Patient knows to schedule an agrees to do so. Benefits of early detection reviewed. 5. DEXA never. Will plan further into the menopause. Increase calcium vitamin D reviewed. Check vitamin D level today. Vitamin D level 24 when previously when checked. 6. Health maintenance. Patient has  had baseline hepatitis A, B, and C levels done previously. Baseline CBC comprehensive metabolic panel lipid profile urinalysis and vitamin D level done. Followup one year, sooner as needed.  Note: This document was prepared with digital dictation and possible smart phrase technology. Any transcriptional errors that result from this process are unintentional.   [name], 3:28 PM 04/27/2013

## 2013-04-27 NOTE — Patient Instructions (Signed)
Schedule colonoscopy with [facility] at [phone] or [facility] at [phone]  Call to Schedule your mammogram  Facilities in [place]: 1)  [facility], [address] Phone: [phone] 2)  [facility], [address] Phone: [phone] 3)  Dr. Bertrand at [facility]  [address] Phone: [phone]     Mammogram A mammogram is an X-ray test to find changes in a woman's breast. You should get a mammogram if:  You are 40 years of age or older  You have risk factors.   Your doctor recommends that you have one.  BEFORE THE TEST  Do not schedule the test the week before your period, especially if your breasts are sore during this time.  On the day of your mammogram:  Wash your breasts and armpits well. After washing, do not put on any deodorant or talcum powder on until after your test.   Eat and drink as you usually do.   Take your medicines as usual.   If you are diabetic and take insulin, make sure you:   Eat before coming for your test.   Take your insulin as usual.   If you cannot keep your appointment, call before the appointment to cancel. Schedule another appointment.  TEST  You will need to undress from the waist up. You will put on a hospital gown.   Your breast will be put on the mammogram machine, and it will press firmly on your breast with a piece of plastic called a compression paddle. This will make your breast flatter so that the machine can X-ray all parts of your breast.   Both breasts will be X-rayed. Each breast will be X-rayed from above and from the side. An X-ray might need to be taken again if the picture is not good enough.   The mammogram will last about 15 to 30 minutes.  AFTER THE TEST Finding out the results of your test Ask when your test results will be ready. Make sure you get your test  results.  Document Released: 10/01/2008 Document Revised: 06/24/2011 Document Reviewed: 10/01/2008 [employer] Patient Information 2012 [employer].   

## 2013-04-28 LAB — URINALYSIS W MICROSCOPIC + REFLEX CULTURE
Bacteria, UA: NONE SEEN
Bilirubin Urine: NEGATIVE
Casts: NONE SEEN
Crystals: NONE SEEN
Glucose, UA: NEGATIVE mg/dL
Ketones, ur: NEGATIVE mg/dL
Leukocytes, UA: NEGATIVE
Nitrite: NEGATIVE
Protein, ur: NEGATIVE mg/dL
Specific Gravity, Urine: 1.008 (ref 1.005–1.030)
Squamous Epithelial / HPF: NONE SEEN
Urobilinogen, UA: 0.2 mg/dL (ref 0.0–1.0)
pH: 6 (ref 5.0–8.0)

## 2013-04-28 LAB — VITAMIN D 25 HYDROXY (VIT D DEFICIENCY, FRACTURES): Vit D, 25-Hydroxy: 30 ng/mL (ref 30–89)

## 2013-04-30 ENCOUNTER — Other Ambulatory Visit: Payer: Self-pay | Admitting: Gynecology

## 2013-04-30 DIAGNOSIS — E7800 Pure hypercholesterolemia, unspecified: Secondary | ICD-10-CM

## 2013-05-24 ENCOUNTER — Ambulatory Visit (INDEPENDENT_AMBULATORY_CARE_PROVIDER_SITE_OTHER): Payer: BC Managed Care – PPO | Admitting: Family Medicine

## 2013-05-24 ENCOUNTER — Ambulatory Visit: Payer: BC Managed Care – PPO

## 2013-05-24 VITALS — BP 122/76 | HR 66 | Temp 98.1°F | Resp 16 | Ht <= 58 in | Wt 119.0 lb

## 2013-05-24 DIAGNOSIS — R071 Chest pain on breathing: Secondary | ICD-10-CM

## 2013-05-24 DIAGNOSIS — R0789 Other chest pain: Secondary | ICD-10-CM

## 2013-05-24 DIAGNOSIS — I517 Cardiomegaly: Secondary | ICD-10-CM

## 2013-05-24 DIAGNOSIS — R011 Cardiac murmur, unspecified: Secondary | ICD-10-CM

## 2013-05-24 NOTE — Patient Instructions (Addendum)
You appear to have a strain of a muscle in your chest wall. You can try over the counter Ibuprofen or Alleve for the next week if needed. Avoid heavy lifting or overhead lifting for next week, and if not improving - return for recheck.  If any rash or change in area of pain - return immediately or go to an emergency room.  You heart appears enlarged an chest x ray and you have a heart murmur.  We will refer you to a heart specialist.  Return to the clinic or go to the nearest emergency room if any of your symptoms worsen or new symptoms occur.   Heart Murmur A heart murmur is an extra sound heard by your caregiver when listening to your heart with a device called a stethoscope. The sound might be a "hum" or "whoosh" sound heard when the heart beats. The sound comes from turbulence when blood flows through the heart. There are two types of heart murmurs:  Innocent murmurs. Most people with this type of heart murmur do not have a heart problem. Many children have innocent heart murmurs. Your caregiver may suggest some basic testing to know whether your murmur is an innocent murmur. If an innocent heart murmur is found, there is no need for further tests or treatment. Also, there is no need to restrict activities or stop playing sports.  Abnormal murmurs. May have signs and symptoms of heart problems. These types of murmurs can occur in children and adults. In children, abnormal heart murmurs are typically caused from heart defects that are present at birth. In adults, abnormal murmurs are usually from heart valve problems caused by disease, infection, or aging. SYMPTOMS   Innocent murmurs do not cause symptoms or require you to limit physical activity.  Many people with abnormal murmurs may or may not have symptoms. If symptoms do develop, they might include:  Shortness of breath.  Blue coloring of the skin, especially on the fingertips.  Chest pain.  Palpitations or feeling a "fluttering" or a  "skipped" heartbeat.  Fainting.  Persistent cough.  Getting tired much faster than expected. DIAGNOSIS  A heart murmur might be heard during a sports physical or during any type of examination. When a murmur is heard, it may suggest a possible problem. When this happens, your caregiver may ask you to see a heart specialist (cardiologist). You may also be asked to undergo one or more heart tests. In these cases, testing may vary depending upon what your caregiver heard. Tests for a heart murmur might include one or more of the following:  Electrocardiography.  Echocardiography.  Cardiac Magnetic Resonance Imaging (MRI). For children and adults who have an abnormal heart murmur and want to play sports, it is important to complete testing, review test results, and receive recommendations from your caregiver. If heart disease is present, it may be risky to play. Finding out the results of your test Not all test results are available during your visit. If your test results are not back during the visit, make an appointment with your caregiver to find out the results. Do not assume everything is normal if you have not heard from your caregiver or the medical facility. It is important for you to follow up on all of your test results.  TREATMENT  As noted above, innocent murmurs require no treatment or activity restriction. If the murmur represents a problem with the heart, treatment will depend upon the exact nature of the problem. In these cases, medicine  or surgery may be needed to treat the problem. HOME CARE INSTRUCTIONS If you want to participate in sports or other types of strenuous physical activity, it is important to discuss this first with your caregiver. If the murmur represents a problem with the heart and you choose to participate in sports, there is a small chance that a serious problem (including sudden death) could result.  SEEK MEDICAL CARE IF:   You feel that your symptoms are  slowly worsening.  You develop any new symptoms that cause concern.  You feel that you are having side effects from any medicines prescribed. SEEK IMMEDIATE MEDICAL CARE IF:   Chest pain develops.  You are short of breath.  You notice that your heart beats irregularly often enough to cause you to worry.  You have fainting spells.  There is a worsening of any problems. Document Released: 08/12/2004 Document Revised: 10/30/2012 Document Reviewed: 09/12/2007 [facility] Patient Information 2014 [place], [place].    Chest Wall Pain Chest wall pain is pain in or around the bones and muscles of your chest. It may take up to 6 weeks to get better. It may take longer if you must stay physically active in your work and activities.  CAUSES  Chest wall pain may happen on its own. However, it may be caused by:  A viral illness like the flu.  Injury.  Coughing.  Exercise.  Arthritis.  Fibromyalgia.  Shingles. HOME CARE INSTRUCTIONS   Avoid overtiring physical activity. Try not to strain or perform activities that cause pain. This includes any activities using your chest or your abdominal and side muscles, especially if heavy weights are used.  Put ice on the sore area.  Put ice in a plastic bag.  Place a towel between your skin and the bag.  Leave the ice on for 15-20 minutes per hour while awake for the first 2 days.  Only take over-the-counter or prescription medicines for pain, discomfort, or fever as directed by your caregiver. SEEK IMMEDIATE MEDICAL CARE IF:   Your pain increases, or you are very uncomfortable.  You have a fever.  Your chest pain becomes worse.  You have new, unexplained symptoms.  You have nausea or vomiting.  You feel sweaty or lightheaded.  You have a cough with phlegm (sputum), or you cough up blood. MAKE SURE YOU:   Understand these instructions.  Will watch your condition.  Will get help right away if you are not doing well or get  worse. Document Released: 07/05/2005 Document Revised: 09/27/2011 Document Reviewed: 03/01/2011 [facility] Patient Information 2014 [place], [place].

## 2013-05-24 NOTE — Progress Notes (Addendum)
Subjective:    Patient ID: Mandy Williams, female    DOB: 1956/07/14, 57 y.o.   MRN: [id number]  HPI HPI Comments: Mandy Williams is a 57 y.o. female who presents to the [facility] complaining of constant upper right side pain onset while lifting a box at work earlier today. Reports unchanged mild non-productive cough. Reports pain is exacerbated by coughing. Denies any alleviating factors or attempted treatments. Denies fever, rash, shoulder pain, shortness of breath, neck pain, back pain, chest pain, lightheadedness, syncope, dizziness and other related injuries.  Son is translating vietnamese   Review of Systems  Constitutional: Negative for fever.  Respiratory: Positive for cough.         Objective:   Physical Exam  Vitals reviewed. Constitutional: She is oriented to person, place, and time. She appears well-developed and well-nourished.  HENT:  Head: Normocephalic and atraumatic.  Eyes: Conjunctivae and EOM are normal. Pupils are equal, round, and reactive to light.  Neck: Carotid bruit is not present.  Cardiovascular: Normal rate, regular rhythm and intact distal pulses.  Exam reveals no friction rub.   Murmur heard. 2/6 systolic murmur left upper sternal boarder    Pulmonary/Chest: Effort normal and breath sounds normal. She has no wheezes. She has no rales.  No rhonchi   Abdominal: Soft. She exhibits no distension and no pulsatile midline mass. There is no tenderness.  Musculoskeletal: She exhibits tenderness.  TTP over anterior axillary line and midaxillary line at approximately 8th rib   C-spine full ROM and non-tender  Full ROM of right shoulder and non-tender   Full rotator cuff strength and equal bilaterally   Neurological: She is alert and oriented to person, place, and time.  Skin: Skin is warm and dry. No rash noted.  No vesicles   Psychiatric: She has a normal mood and affect. Her behavior is normal.      Filed Vitals:   05/24/13 1842  BP: 122/76  Pulse: 66  Temp: 98.1  F (36.7 C)  Resp: 16  Height: 4\' 7"  (1.397 m)  Weight: 119 lb (53.978 kg)  SpO2: 97%   [facility] reading (PRIMARY) by  Dr. Neva Seat: CXR and R rib series: No apparent fracture, cardiomegaly noted.   Assessment & Plan:  Mandy Williams is a 57 y.o. female Right-sided chest wall pain - Plan: DG Ribs Unilateral W/Chest Right - reproducible with exam, suspected chest wall strain.  Sx care, XR to overread, and restrictions for work for now. RTC /ER precautions and recheck if not improving.   Heart murmur, Cardiomegaly - Plan: Ambulatory referral to Cardiology - asymptomatic, ? Aortic stenosis. Suspect she will need echo, and eval by cardiology. Rtc/er precautions.   Language barrier - son translated, understanding expressed.   No orders of the defined types were placed in this encounter.   Patient Instructions  You appear to have a strain of a muscle in your chest wall. You can try over the counter Ibuprofen or Alleve for the next week if needed. Avoid heavy lifting or overhead lifting for next week, and if not improving - return for recheck.  If any rash or change in area of pain - return immediately or go to an emergency room.  You heart appears enlarged an chest x ray and you have a heart murmur.  We will refer you to a heart specialist.  Return to the clinic or go to the nearest emergency room if any of your symptoms worsen or new symptoms occur.   Heart Murmur A heart  murmur is an extra sound heard by your caregiver when listening to your heart with a device called a stethoscope. The sound might be a "hum" or "whoosh" sound heard when the heart beats. The sound comes from turbulence when blood flows through the heart. There are two types of heart murmurs:  Innocent murmurs. Most people with this type of heart murmur do not have a heart problem. Many children have innocent heart murmurs. Your caregiver may suggest some basic testing to know whether your murmur is an innocent murmur. If an innocent  heart murmur is found, there is no need for further tests or treatment. Also, there is no need to restrict activities or stop playing sports.  Abnormal murmurs. May have signs and symptoms of heart problems. These types of murmurs can occur in children and adults. In children, abnormal heart murmurs are typically caused from heart defects that are present at birth. In adults, abnormal murmurs are usually from heart valve problems caused by disease, infection, or aging. SYMPTOMS   Innocent murmurs do not cause symptoms or require you to limit physical activity.  Many people with abnormal murmurs may or may not have symptoms. If symptoms do develop, they might include:  Shortness of breath.  Blue coloring of the skin, especially on the fingertips.  Chest pain.  Palpitations or feeling a "fluttering" or a "skipped" heartbeat.  Fainting.  Persistent cough.  Getting tired much faster than expected. DIAGNOSIS  A heart murmur might be heard during a sports physical or during any type of examination. When a murmur is heard, it may suggest a possible problem. When this happens, your caregiver may ask you to see a heart specialist (cardiologist). You may also be asked to undergo one or more heart tests. In these cases, testing may vary depending upon what your caregiver heard. Tests for a heart murmur might include one or more of the following:  Electrocardiography.  Echocardiography.  Cardiac Magnetic Resonance Imaging (MRI). For children and adults who have an abnormal heart murmur and want to play sports, it is important to complete testing, review test results, and receive recommendations from your caregiver. If heart disease is present, it may be risky to play. Finding out the results of your test Not all test results are available during your visit. If your test results are not back during the visit, make an appointment with your caregiver to find out the results. Do not assume everything  is normal if you have not heard from your caregiver or the medical facility. It is important for you to follow up on all of your test results.  TREATMENT  As noted above, innocent murmurs require no treatment or activity restriction. If the murmur represents a problem with the heart, treatment will depend upon the exact nature of the problem. In these cases, medicine or surgery may be needed to treat the problem. HOME CARE INSTRUCTIONS If you want to participate in sports or other types of strenuous physical activity, it is important to discuss this first with your caregiver. If the murmur represents a problem with the heart and you choose to participate in sports, there is a small chance that a serious problem (including sudden death) could result.  SEEK MEDICAL CARE IF:   You feel that your symptoms are slowly worsening.  You develop any new symptoms that cause concern.  You feel that you are having side effects from any medicines prescribed. SEEK IMMEDIATE MEDICAL CARE IF:   Chest pain develops.  You  are short of breath.  You notice that your heart beats irregularly often enough to cause you to worry.  You have fainting spells.  There is a worsening of any problems. Document Released: 08/12/2004 Document Revised: 10/30/2012 Document Reviewed: 09/12/2007 [facility] Patient Information 2014 [place], [place].    Chest Wall Pain Chest wall pain is pain in or around the bones and muscles of your chest. It may take up to 6 weeks to get better. It may take longer if you must stay physically active in your work and activities.  CAUSES  Chest wall pain may happen on its own. However, it may be caused by:  A viral illness like the flu.  Injury.  Coughing.  Exercise.  Arthritis.  Fibromyalgia.  Shingles. HOME CARE INSTRUCTIONS   Avoid overtiring physical activity. Try not to strain or perform activities that cause pain. This includes any activities using your chest or your  abdominal and side muscles, especially if heavy weights are used.  Put ice on the sore area.  Put ice in a plastic bag.  Place a towel between your skin and the bag.  Leave the ice on for 15-20 minutes per hour while awake for the first 2 days.  Only take over-the-counter or prescription medicines for pain, discomfort, or fever as directed by your caregiver. SEEK IMMEDIATE MEDICAL CARE IF:   Your pain increases, or you are very uncomfortable.  You have a fever.  Your chest pain becomes worse.  You have new, unexplained symptoms.  You have nausea or vomiting.  You feel sweaty or lightheaded.  You have a cough with phlegm (sputum), or you cough up blood. MAKE SURE YOU:   Understand these instructions.  Will watch your condition.  Will get help right away if you are not doing well or get worse. Document Released: 07/05/2005 Document Revised: 09/27/2011 Document Reviewed: 03/01/2011 [facility] Patient Information 2014 [place], [place].

## 2013-11-02 ENCOUNTER — Ambulatory Visit (INDEPENDENT_AMBULATORY_CARE_PROVIDER_SITE_OTHER): Payer: BC Managed Care – PPO | Admitting: Family Medicine

## 2013-11-02 ENCOUNTER — Ambulatory Visit: Payer: BC Managed Care – PPO

## 2013-11-02 VITALS — BP 124/80 | HR 90 | Temp 97.9°F | Resp 18 | Ht <= 58 in | Wt 124.4 lb

## 2013-11-02 DIAGNOSIS — M25539 Pain in unspecified wrist: Secondary | ICD-10-CM

## 2013-11-02 DIAGNOSIS — M2550 Pain in unspecified joint: Secondary | ICD-10-CM

## 2013-11-02 LAB — POCT SEDIMENTATION RATE: POCT SED RATE: 51 mm/hr — AB (ref 0–22)

## 2013-11-02 MED ORDER — PREDNISONE 20 MG PO TABS: 40.0000 mg | ORAL_TABLET | Freq: Every day | ORAL | Status: DC

## 2013-11-02 NOTE — Progress Notes (Signed)
   Subjective:    Patient ID: Mandy Williams, female    DOB: [birth year], 58 y.o.   MRN: [id number]  HPI This chart was scribed for Mandy Williams-MD, by Ladona Ridgel Elray Dains, Scribe. This patient was seen in room 5 and the patient's care was started at 11:02 AM.  HPI Comments: Mandy Williams is a 58 y.o. female who presents to the [facility] for right wrist pain and swelling that began yesterday after she finished work. She works at a factory and pairs socks at work; she states that she is performing repetitive movements all [place] while at work. Her daughter gave her some Aleve which she took yesterday and had no relief from her pain/swelling. She states that she is aching throughout her neck, bilateral feet and bilateral elbows. She denies any trauma recently and that this began spontaneously at the end of her work [employer] yesterday.    No past medical history on file.  No Known Allergies  Meds ordered this encounter  Medications  . naproxen sodium (ANAPROX) 220 MG tablet    Sig: Take 220 mg by mouth 2 (two) times daily with a meal.   Review of Systems  Constitutional: Negative for fever and chills.  Respiratory: Negative for cough and shortness of breath.   Cardiovascular: Negative for chest pain.  Gastrointestinal: Negative for abdominal pain.  Musculoskeletal: Positive for neck stiffness. Negative for back pain.       Right wrist pain/swelling       Objective:   Physical Exam Physical Exam  Nursing note and vitals reviewed. Constitutional: Patient is oriented to person, place, and time. Patient appears well-developed and well-nourished. No distress.  HENT:  Head: Normocephalic and atraumatic.  Neck: Neck supple. No tracheal deviation present.  Cardiovascular: Normal rate, regular rhythm and normal heart sounds.   No murmur heard. Pulmonary/Chest: Effort normal and breath sounds normal. No respiratory distress. Patient has no wheezes. Patient has no rales.  Musculoskeletal: Stiff  right wrist with mild diffuse swelling at the base of the thumb.  Neurological: Patient is alert and oriented to person, place, and time.  Skin: Skin is warm and dry.  Psychiatric: Patient has a normal mood and affect. Patient's behavior is normal.   Triage Vitals: BP 124/80  Pulse 90  Temp(Src) 97.9 F (36.6 C) (Oral)  Resp 18  Ht 4' 6.5" (1.384 m)  Wt 124 lb 6.4 oz (56.427 kg)  BMI 29.46 kg/m2  SpO2 98% [facility] reading (PRIMARY) by  Dr. Milus Glazier:  Right wrist.      Assessment & Plan:  DIAGNOSTIC STUDIES: Oxygen Saturation is 98% on room air, normal by my interpretation.    COORDINATION OF CARE: At 1100 AM Discussed treatment plan with patient. Patient agrees.   I personally performed the services described in this documentation, which was scribed in my presence. The recorded information has been reviewed and is accurate.

## 2013-11-02 NOTE — Patient Instructions (Signed)
Tendinitis Tendinitis is swelling and inflammation of the tendons. Tendons are band-like tissues that connect muscle to bone. Tendinitis commonly occurs in the:   Shoulders (rotator cuff).  Heels (Achilles tendon).  Elbows (triceps tendon). CAUSES Tendinitis is usually caused by overusing the tendon, muscles, and joints involved. When the tissue surrounding a tendon (synovium) becomes inflamed, it is called tenosynovitis. Tendinitis commonly develops in people whose jobs require repetitive motions. SYMPTOMS  Pain.  Tenderness.  Mild swelling. DIAGNOSIS Tendinitis is usually diagnosed by physical exam. Your caregiver may also order X-rays or other imaging tests. TREATMENT Your caregiver may recommend certain medicines or exercises for your treatment. HOME CARE INSTRUCTIONS   Use a sling or splint for as long as directed by your caregiver until the pain decreases.  Put ice on the injured area.  Put ice in a plastic bag.  Place a towel between your skin and the bag.  Leave the ice on for 15-20 minutes, 03-04 times a day.  Avoid using the limb while the tendon is painful. Perform gentle range of motion exercises only as directed by your caregiver. Stop exercises if pain or discomfort increase, unless directed otherwise by your caregiver.  Only take over-the-counter or prescription medicines for pain, discomfort, or fever as directed by your caregiver. SEEK MEDICAL CARE IF:   Your pain and swelling increase.  You develop new, unexplained symptoms, especially increased numbness in the hands. MAKE SURE YOU:   Understand these instructions.  Will watch your condition.  Will get help right away if you are not doing well or get worse. Document Released: 07/02/2000 Document Revised: 09/27/2011 Document Reviewed: 09/21/2010 [facility] Patient Information 2014 [place], [place]. Vim Gn (Tendinitis) Vim gn l tnh tr?ng s?ng t?y v vim c?a m ???c g?i l gn. Gn c c?u trc  gi?ng nh? s?i dy g?n c? vo x??ng. Vim gn th??ng x?y ra ?:  Vai (chp xoay).  Gt chn (gn gt achilles).  Khu?u tay (gn c? tam ??u). NGUYN NHN Vim gn th??ng gy ra b?i vi?c l?m d?ng gn, c? v kh?p lin quan. Khi m xung quanh gn (mng ho?t d?ch) b? vim, n ???c g?i l vim bao gn. Vim gn th??ng pht tri?n ? nh?ng ng??i c cng vi?c ?i h?i c? ??ng l?p ?i l?p l?i. TRI?U CH?NG  ?au.  Nh?y c?m ?au.  S?ng nh?. CH?N ?ON Vim gn th??ng ???c ch?n ?on b?ng cch khm th?c th?Paulino Rily gia ch?m [place] s?c kh?e c?a b?n c?ng c th? yu c?u ch?p X-quang ho?c cc xt nghi?m hnh ?nh khc. ?I?U TR? Chuyn gia ch?m [place] s?c kh?e c?a b?n c th? gi?i thi?u m?t s? lo?i thu?c ho?c cc bi t?p cho vi?c ?i?u tr? c?a b?n. H??NG D?N CH?M [place] T?I NH  Dng b?ng ?eo hay n?p cho ??n khi gi?m ?au mi?n l theo s? ch? d?n c?a chuyn gia ch?m [place] y t? c?a b?n.  Ch??m ? l?nh ln vng b? th??ng.  Cho ? l?nh vo ti nh?a.  ??t kh?n t?m gi?a da v ti.  Ch??m ? l?nh trong 15 ??n 20 pht, 3 ??n 4 l?n m?i ngy.  Hessie Diener s? d?ng chn tay trong khi ?au gn. Ch? th?c hi?n hng lo?t bi t?p chuy?n ??ng nh? nhng theo ch? d?n c?a chuyn gia ch?m [place] s?c kh?e. Ng?ng t?p n?u ?au t?ng ho?c gia t?ng s? kh ch?u, tr? khi ???c chuyn gia ch?m [place] s?c kh?e ch? ??nh khc.  Ch? dng cc thu?c khng c?n k toa  ho?c thu?c c?n k toa ?? gi?m ?au, gi?m c?m gic kh ch?u, ho?c h? s?t theo nh? h??ng d?n c?a chuyn gia ch?m [place] s?c kh?e. HY ?I KHM N?U:  ?au hay s?ng nhi?u h?n.  Xu?t hi?n cc tri?u ch?ng m?i, khng gi?i thch ???c, ??c bi?t l t tay nhi?u h?n. ??M B?O B?N:  Hi?u r nh?ng h??ng d?n ny.  S? theo di tnh tr?ng b?nh c?a b?n.  S? yu c?u tr? gip ngay l?p t?c n?u b?n khng ?? ho?c tnh tr?ng tr?m tr?ng h?n. Document Released: 07/05/2005 Document Revised: 03/07/2013 [facility] Patient Information 2014 [place], [place]. Reactive Arthritis Reactive arthritis (formerly known as Reiter's syndrome) is  a group of illnesses that involves redness, soreness, and swelling (inflammation) of the joints, genital tract, and eyes. It is most common in males between the ages of 43 and 76. Reiter's syndrome is one specific type of reactive arthritis. CAUSES   This condition may follow food poisoning caused by a bacterial infection of Salmonella, Shigella, Camphylobacter, or Yersinia.  This condition can also follow an infection of the sexually transmitted disease (STD) chlamydia.  Having a certain gene may make you more prone to develop reactive arthritis (HLA-B27 gene). SYMPTOMS   Inflammation of the joints, especially large joints. Hip, knee, and ankle pain are common.  Low back or foot pain.  Thick, crusty, reddish-purple sores on the palms of the hands and soles of the feet.  Low-grade fever.  Frequent or painful urination (dysuria).  Genital sores, which may be painful and can become infected.  Pelvic pain.  Blurred vision, eye pain, and red, sore eyes (conjunctivitis). Eyelids may stick together in the morning.  Sores in the mouth. These may be painful or painless. DIAGNOSIS  The diagnosis is based on your symptoms. This process may be delayed because the symptoms may occur at different times. A history and physical exam may help suggest the cause of the arthritis. Blood tests may be done to rule out other forms of arthritis or to see if you have the HLA-B27 gene. Joint X-rays and urine tests (urinalysis) may also be done. TREATMENT  The goal of treatment is to relieve symptoms and to treat any underlying infection.  Medicines that kill germs (antibiotics) are often given to treat an initial infection, if found. However, they may not stop reactive arthritis from developing. It is best for your sexual partners to be treated with antibiotics as well, even if they have no symptoms and do not test positive for an STD. Reactive arthritis itself is not sexually transmitted and cannot be passed  from person-to-person (noncontagious), but an infection that triggers it may be passed from person-to-person.  Nonsteroidal anti-inflammatory drugs (NSAIDs) are often used to treat reactive arthritis. These medicines reduce pain and swelling of the joints and decrease stiffness. However, they do not prevent further joint damage.  Eye problems and skin sores will go away on their own.  Long-term (chronic) reactive arthritis may need to be treated with a disease-modifying antirheumatic drug (DMARD), such as sulfasalazine or methotrexate. These medicines take longer to become fully effective but can cause a decrease in symptoms (remission).  In some cases, very inflamed joints may be injected with corticosteroids to reduce inflammation. PREVENTION  Preventing STDs and gastrointestinal infection may help prevent this disease. Wearing a condom during sexual intercourse can reduce your risk of STDs. HOME CARE INSTRUCTIONS   Eat a well-balanced diet. This is helpful in almost all disease states.  Exercise regularly. This will  help maintain muscle strength. This helps your joints stay aligned and have less pain. Low-impact exercises, such as swimming, walking, water aerobics, and bicycling, can reduce pain and help maintain strength and flexibility.  Put ice on the sore joint.  Put ice in a plastic bag.  Place a towel between your skin and the bag.  Leave the ice on for 15-20 minutes at a time, 03-04 times a day.  You may also use a warm heating pad as directed by your caregiver.  Alternate heat and cold. Cold is usually best following exercise. Heat is best for warming up prior to exercising or using your joints.  Do not sleep with a heating pad.If you are diabetic, do not use a heating pad unless instructed to do so. SEEK MEDICAL CARE IF:   You develop hot, swollen joints which are getting worse.  You have an oral temperature above 102 F (38.9 C).  Any of your symptoms seem to be  getting worse rather than better. FOR MORE INFORMATION  American College of Rheumatology: www.rheumatology.org Document Released: 06/02/2005 Document Revised: 09/27/2011 Document Reviewed: 10/20/2009 [facility] Patient Information 2014 [place].

## 2013-11-10 ENCOUNTER — Other Ambulatory Visit: Payer: Self-pay | Admitting: Physician Assistant

## 2013-11-10 ENCOUNTER — Ambulatory Visit (INDEPENDENT_AMBULATORY_CARE_PROVIDER_SITE_OTHER): Payer: BC Managed Care – PPO | Admitting: Physician Assistant

## 2013-11-10 ENCOUNTER — Ambulatory Visit: Payer: BC Managed Care – PPO

## 2013-11-10 VITALS — BP 112/70 | HR 75 | Temp 97.4°F | Resp 16 | Ht <= 58 in | Wt 124.4 lb

## 2013-11-10 DIAGNOSIS — M79643 Pain in unspecified hand: Secondary | ICD-10-CM

## 2013-11-10 DIAGNOSIS — R209 Unspecified disturbances of skin sensation: Secondary | ICD-10-CM

## 2013-11-10 DIAGNOSIS — R768 Other specified abnormal immunological findings in serum: Secondary | ICD-10-CM

## 2013-11-10 DIAGNOSIS — M25579 Pain in unspecified ankle and joints of unspecified foot: Secondary | ICD-10-CM

## 2013-11-10 DIAGNOSIS — Z1159 Encounter for screening for other viral diseases: Secondary | ICD-10-CM

## 2013-11-10 DIAGNOSIS — M542 Cervicalgia: Secondary | ICD-10-CM

## 2013-11-10 DIAGNOSIS — R202 Paresthesia of skin: Secondary | ICD-10-CM

## 2013-11-10 DIAGNOSIS — M79609 Pain in unspecified limb: Secondary | ICD-10-CM

## 2013-11-10 DIAGNOSIS — R7989 Other specified abnormal findings of blood chemistry: Secondary | ICD-10-CM

## 2013-11-10 LAB — POCT CBC
Granulocyte percent: 69.4 %G (ref 37–80)
HCT, POC: 39.5 % (ref 37.7–47.9)
Hemoglobin: 12.4 g/dL (ref 12.2–16.2)
Lymph, poc: 3.3 (ref 0.6–3.4)
MCH, POC: 23.2 pg — AB (ref 27–31.2)
MCHC: 31.4 g/dL — AB (ref 31.8–35.4)
MCV: 73.9 fL — AB (ref 80–97)
MID (cbc): 0.8 (ref 0–0.9)
MPV: 8.5 fL (ref 0–99.8)
POC Granulocyte: 9.4 — AB (ref 2–6.9)
POC LYMPH PERCENT: 24.8 %L (ref 10–50)
POC MID %: 5.8 %M (ref 0–12)
Platelet Count, POC: 429 10*3/uL — AB (ref 142–424)
RBC: 5.35 M/uL (ref 4.04–5.48)
RDW, POC: 15.2 %
WBC: 13.5 10*3/uL — AB (ref 4.6–10.2)

## 2013-11-10 MED ORDER — CYCLOBENZAPRINE HCL 10 MG PO TABS: 10.0000 mg | ORAL_TABLET | Freq: Every day | ORAL | Status: DC

## 2013-11-10 MED ORDER — MELOXICAM 15 MG PO TABS: 15.0000 mg | ORAL_TABLET | Freq: Every day | ORAL | Status: DC

## 2013-11-10 NOTE — Patient Instructions (Signed)
Stop the naproxen (Aleve). Take the new prescriptions instead.  I will contact you with your lab results as soon as they are available.   If you have not heard from me in 2 weeks, please contact me.  The fastest way to get your results is to register for My Chart (see the instructions on the last page of this printout).

## 2013-11-10 NOTE — Progress Notes (Signed)
Subjective:    Patient ID: Mandy Williams, female    DOB: 1956/01/13, 58 y.o.   MRN: [id number]   PCP: No primary provider on file.  Chief Complaint  Patient presents with  . Neck Pain    x 1 year  . Ankle Pain    both   . Wrist Pain    both       Active Ambulatory Problems    Diagnosis Date Noted  . Hand pain 04/18/2012  . Neck pain 11/10/2013  . Ankle pain 11/10/2013   Resolved Ambulatory Problems    Diagnosis Date Noted  . No Resolved Ambulatory Problems   No Additional Past Medical History    History reviewed. No pertinent past surgical history.  No Known Allergies  Prior to Admission medications   Medication Sig Start Date End Date Taking? Authorizing Provider  naproxen sodium (ANAPROX) 220 MG tablet Take 220 mg by mouth 2 (two) times daily with a meal.    Historical Provider, MD    History   Social History  . Marital Status: Married    Spouse Name: Andree Moro    Number of Children: 2  . Years of Education: none   Occupational History  . 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 Smoking status: Never Smoker   . Smokeless tobacco: Never Used  . Alcohol Use: No  . Drug Use: No  . Sexual Activity: No   Other Topics Concern  . None   Social History Narrative   From [place].  Came to the [place] 1992.   Lives with her husband, their children, and her daughter's husband and their son.        family history includes Asthma in her daughter. indicated that her mother is deceased. She indicated that her father is deceased. She indicated that her daughter is alive. She indicated that her son is alive.   HPI  Translation is provided by a close family friend.  She describes pain that waxes and wanes in the neck, wrists and ankles for the past year.  It got particularly bad last week after a shift at work, and she was evaluated here by another provider.  She was given prednisone for several days, which she reports really  helped, but now that she has completed it, her pain is back.  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  This makes her pain worse.  Prolonged repetitive motion.  No cough, SOB, HA, dizziness.  Review of Systems     Objective:   Physical Exam   C-Spine: [facility] reading (PRIMARY) by  Dr. Katrinka Blazing.  Loss of lordotic curve. Early degenerative changes.  Apparance of spondylolisthesis at C6-7, seen only on one view.  RIGHT Wrist: [facility] reading (PRIMARY) by  Dr. Katrinka Blazing. Normal RIGHT Wrist.  LEFT Wrist: [facility] reading (PRIMARY) by  Dr. Katrinka Blazing. Normal LEFT wrist.      Assessment & Plan:  1. Neck pain This may be positional, due to her job. Await other labs, as with the other joint complaints, we may discover an underlying connective tissue/rheumatologic problem. - DG Cervical Spine Complete; Future - Sedimentation rate - meloxicam (MOBIC) 15 MG tablet; Take 1 tablet (15 mg total) by mouth daily.  Dispense: 30 tablet; Refill: 0 - cyclobenzaprine (FLEXERIL) 10 MG tablet; Take 1 tablet (10 mg total) by mouth at bedtime.  Dispense: 30 tablet; Refill: 0  2. Ankle  pain 3. Hand pain See above. - ANA - Rheumatoid factor - DG Wrist 2 Views Left; Future - DG Wrist 2 Views Right; Future - Sedimentation rate  4. Paresthesia of both hands See above. - POCT CBC - Comprehensive metabolic panel - TSH - Sedimentation rate  5. Need for hepatitis C screening test - Hepatitis C antibody - Sedimentation rate   Fernande Bras, PA-C Physician Assistant-Certified [facility]

## 2013-11-11 LAB — SEDIMENTATION RATE: Sed Rate: 30 mm/hr — ABNORMAL HIGH (ref 0–22)

## 2013-11-11 LAB — TSH: TSH: 0.226 u[IU]/mL — ABNORMAL LOW (ref 0.350–4.500)

## 2013-11-11 LAB — COMPREHENSIVE METABOLIC PANEL
ALT: 23 U/L (ref 0–35)
AST: 14 U/L (ref 0–37)
Albumin: 3.8 g/dL (ref 3.5–5.2)
Alkaline Phosphatase: 72 U/L (ref 39–117)
BUN: 23 mg/dL (ref 6–23)
CO2: 25 mEq/L (ref 19–32)
Calcium: 9.1 mg/dL (ref 8.4–10.5)
Chloride: 101 mEq/L (ref 96–112)
Creat: 0.79 mg/dL (ref 0.50–1.10)
Glucose, Bld: 130 mg/dL — ABNORMAL HIGH (ref 70–99)
Potassium: 4 mEq/L (ref 3.5–5.3)
Sodium: 135 mEq/L (ref 135–145)
Total Bilirubin: 0.6 mg/dL (ref 0.2–1.2)
Total Protein: 7.3 g/dL (ref 6.0–8.3)

## 2013-11-11 LAB — RHEUMATOID FACTOR: Rhuematoid fact SerPl-aCnc: 391 IU/mL — ABNORMAL HIGH (ref <=14)

## 2013-11-11 LAB — HEPATITIS C ANTIBODY: HCV Ab: NEGATIVE

## 2013-11-12 ENCOUNTER — Encounter: Payer: Self-pay | Admitting: Physician Assistant

## 2013-11-12 LAB — ANA: Anti Nuclear Antibody(ANA): NEGATIVE

## 2013-11-14 LAB — T4, FREE: Free T4: 1.84 ng/dL — ABNORMAL HIGH (ref 0.80–1.80)

## 2013-11-14 LAB — T3, FREE: T3, Free: 2.4 pg/mL (ref 2.3–4.2)

## 2013-11-20 NOTE — Addendum Note (Signed)
Addended by: Fernande Bras on: 11/20/2013 08:30 AM   Modules accepted: Orders

## 2013-11-29 ENCOUNTER — Encounter: Payer: Self-pay | Admitting: Internal Medicine

## 2013-11-29 ENCOUNTER — Encounter: Payer: Self-pay | Admitting: *Deleted

## 2013-11-29 ENCOUNTER — Ambulatory Visit (INDEPENDENT_AMBULATORY_CARE_PROVIDER_SITE_OTHER): Payer: BC Managed Care – PPO | Admitting: Internal Medicine

## 2013-11-29 VITALS — BP 126/76 | HR 81 | Temp 98.2°F | Resp 12 | Ht <= 58 in | Wt 125.0 lb

## 2013-11-29 DIAGNOSIS — E0590 Thyrotoxicosis, unspecified without thyrotoxic crisis or storm: Secondary | ICD-10-CM | POA: Insufficient documentation

## 2013-11-29 NOTE — Patient Instructions (Signed)
Please come back for a follow-up appointment in 3 months. Please stop at the lab. We will call you with the results and will see then if we need a thyroid Uptake and scan next.  C??ng Gip (Hyperthyroidism) Tuy?n gip l m?t tuy?n l?n n?m ? ph?n d??i, pha tr??c c?. Tuy?n gip c tc d?ng ki?m sot chuy?n ho. Chuy?n ha l cch c? th? s? d?ng th?c ?n. Tuy?n ny ki?m sot chuy?n ha b?ng hc-mn thyroxine. Khi tuy?n gip ho?t ??ng qu tch c?c, tuy?n s?n xu?t ra qu nhi?u hc-mn. Khi ?i?u ny x?y ra, c th? xu?t hi?n nh?ng v?n ?? sau ?y:   Lo l?ng  Khng ch?u ???c nng  St cn (ngay c? khi ?n nhi?u th?c ?n h?n)  Tiu ch?y  Thay ??i c?u trc c?a tc ho?c da  ?nh tr?ng ng?c (tim b? nh?p ho?c ??p nhi?u nh?p h?n)  Nh?p tim nhanh  M?t kinh nguy?t  Run tay NGUYN NHN  B?[place] (H? th?ng mi?n d?ch c? th? gy t?n th??ng tuy?n gip). ?y l nguyn nhn th??ng g?p nh?t.  Vim tuy?n gip.  U (th??ng lnh tnh) ? tuy?n gip ho?c ? m?t n?i no khc.  S? d?ng qu nhi?u thu?c ?i?u tr? b?nh tuy?n gip (c? k oa v 't? nhin').  ?n nhi?u I-?t. CH?N ?ON ?? ch?ng t? l c c??ng gip, chuyn gia ch?m [place] s?c kh?e c th? th?c hi?n cc xt nghi?m v? mu v siu m. ?i khi cc d?u hi?u ?n. ?i?u c?n thi?t l chuyn gia ch?m [place] s?c kh?e ph?i theo di b?nh cng v?i cc xt nghi?m mu, ho?c l tr??c hay sau khi ch?n ?on v ?i?u tr?. ?I?U TR? ?i?u tr? trong th?i gian ng?n C m?t s? bi?n php ?i?u tr? ?? ki?m sot cc tri?u ch?ng. Nh?ng lo?i thu?c c tn l thu?c ch?n b ta c th? dng ?? lm gi?m cc tri?u ch?ng. Cc lo?i thu?c lm gi?m s?n sinh hc-mn s? lm gi?m t?m th?i cc tri?u ch?ng ? nhi?u ng??i. Cc bi?n php ny th??ng khng lm gi?m tri?u ch?ng v?nh vi?n. ?i?u tr? d?t ?i?m C cc bi?n php ?i?u tr? c th? th?o lu?n v?i chuyn gia ch?m [place] s?c kh?e c?a b?n. Nh?ng bi?n php ?i?u tr? ny trogn ph?m vi t? ph?u thu?t (c?t b? tuy?n gip) t?i s? d?ng i-?t phng x? (ph h?y tuy?n gip  b?ng phng x?), t?i vi?c s? d?ng cc lo?i thu?c ch?ng tuy?n gip (c?n tr? vi?c t?ng h?p hc-mn). Hai bi?n php ?i?u tr? ??u tin s? kh?i v?nh vi?n v th??ng l thnh cng. Cc bi?n php ny ?a s? th??ng c?n ?i?u tr? b?ng cch thay th? hc-mn c? ??i. Nguyn nhn l do khng th? c?t b? ho?c ph hu? ?ng s? l??ng tuy?n gip c?n ?? m?t ng??i c tuy?n gip bnh th??ng. H??NG D?N CH?M [place] T?I NH ??n g?p chuyn gia ch?m [place] s?c kh?e n?u cc v?n ?? m b?n ?ang ???c ?i?u tr? tr?m tr?ng h?n. V d? v? vi?c ny l cc v?n ?? ???c li?t k ? trn. ?I KHM B?NH N?U: Tnh tr?ng ton thn c?a b?n tr?m tr?ng h?n. HY CH?C CH?N R?NG B?N:  Hi?u r nh?ng h??ng d?n ny.  S? theo di tnh tr?ng b?nh c?a b?n.  S? yu c?u tr? gip ngay l?p t?c n?u b?n khng ?? ho?c tnh tr?ng tr?m tr?ng h?n. Document Released: 07/05/2005 Document Revised: 03/07/2013 [facility] Patient Information 2014 [place], [place].

## 2013-11-29 NOTE — Progress Notes (Signed)
Patient ID: Mandy Williams, female   DOB: 09-16-1955, 58 y.o.   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 She is here with uncle and husband and they translate for [place] and offer part of the hx. We still had a hard time communicating, especially since they thought they were referred to me for generalized mm pain.  Pt was recently found to have a low TSH and high free t4 (11/10/2013).  I reviewed pt's thyroid tests: Lab Results  Component Value Date   TSH 0.226* 11/10/2013   FREET4 1.84* 11/10/2013  Free T3 2.4 (2.3-4.2)  Pt denies feeling nodules in neck, hoarseness, + dysphagia sometimes with drinks/no odynophagia, sometimes cough, no SOB with lying down;  she c/o: - pain in arms, legs, back. It is hard for her to move her body because of this.  - excessive sweating/heat intolerance - no tremors - no anxiety - no palpitations - no fatigue - no hyperdefecation - no weight loss  Pt does not have a FH of thyroid ds. No FH of thyroid cancer. No h/o radiation tx to head or neck.  ? seaweed or kelp, no recent contrast studies. No steroid use. No herbal supplements.   ROS: Constitutional: no weight gain/loss, no fatigue, no subjective hyperthermia/hypothermia Eyes: no blurry vision, no xerophthalmia ENT: no sore throat, no nodules palpated in throat, no dysphagia/odynophagia, no hoarseness Cardiovascular: no CP/SOB/palpitations/+ leg swelling Respiratory: no cough/SOB Gastrointestinal: no N/V/D/C Musculoskeletal: + muscle pain/+ joint aches Skin: no rashes Neurological: no tremors/numbness/tingling/dizziness Psychiatric: no depression/anxiety  History   Social History  . 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 Smoking status: Never Smoker   .  Smokeless tobacco: Never Used  . Alcohol Use: Yes  . Drug Use: No   Social History Narrative   From [place].  Came to the [place] 1992.   Lives with her husband, their children, and her daughter's husband and their son.       Current Outpatient Prescriptions on File Prior to Visit  Medication Sig Dispense Refill  . cyclobenzaprine (FLEXERIL) 10 MG tablet Take 1 tablet (10 mg total) by mouth at bedtime.  30 tablet  0  . meloxicam (MOBIC) 15 MG tablet Take 1 tablet (15 mg total) by mouth daily.  30 tablet  0   No current facility-administered medications on file prior to visit.   No Known Allergies Family History  Problem Relation Age of Onset  . Asthma Daughter    PE: BP 126/76  Pulse 81  Temp(Src) 98.2 F (36.8 C) (Oral)  Resp 12  Ht 4' 7.5" (1.41 m)  Wt 125 lb (56.7 kg)  BMI 28.52 kg/m2  SpO2 96% Wt Readings from Last 3 Encounters:  11/29/13 125 lb (56.7 kg)  11/10/13 124 lb 6.4 oz (56.427 kg)  11/02/13 124 lb 6.4 oz (56.427 kg)   Constitutional: overweight, in NAD Eyes: PERRLA, EOMI, no exophthalmos, no lid lag, no stare ENT: moist mucous membranes, no thyromegaly, no thyroid bruits, no cervical lymphadenopathy Cardiovascular: RRR, No MRG Respiratory: CTA B Gastrointestinal: abdomen soft, NT, ND, BS+ Musculoskeletal: no deformities, cannot raise arms b/c pain Skin: moist, warm, no rashes Neurological: no tremor with outstretched hands, DTR normal in all  4  ASSESSMENT: 1. Thyrotoxycosis  PLAN:  1. Patient with recently found thyrotoxicosis, without thyrotoxic sxs: weight loss, heat intolerance, hyperdefecation, palpitations, anxiety.  - she does not appear to have exogenous causes for the low TSH.  - We discussed that possible causes of thyrotoxicosis are:  Graves ds   Thyroiditis toxic multinodular goiter/ toxic adenoma (I cannot feel nodules at palpation of his thyroid). - I suggested that we check the TSH, fT3 and fT4  - If the tests remain abnormal (which I  suspect) I will obtain an uptake and scan to differentiate between the 3 above possible etiologies  - we might need to do thyroid ultrasound depending on the results of the uptake and scan (if a cold nodule is present) - no need to add beta blockers at this time, since she is not tachycardic, anxious, or tremulous - RTC in 3 months, but likely sooner for repeat labs  Preferred tel nr: (daughter Glade Lloyd): [phone]  Office Visit on 11/29/2013  Component Date Value Ref Range Status  . TSH 11/29/2013 0.56  0.35 - 4.50 uIU/mL Final  . Free T4 11/29/2013 1.10  0.60 - 1.60 ng/dL Final  . T3, Free 16/94/5038 2.4  2.3 - 4.2 pg/mL Final   All labs normal.   Will suggest to get a thyroid U/S as she has some neck compression sxs.  12/06/2013 EXAM:  THYROID ULTRASOUND  TECHNIQUE:  Ultrasound examination of the thyroid gland and adjacent soft  tissues was performed.  COMPARISON: None.  FINDINGS:  Right thyroid lobe  Measurements: 5.0 x 1.8 x 2.2 cm. A complex primarily solid nodule  occupies the mid right lobe of thyroid measuring 2.4 x 1.3 x 1.7 cm.  This nodule does meet criteria for percutaneous biopsy.  Left thyroid lobe  Measurements: 4.9 x 1.2 x 1.6 cm. No left thyroid nodules are  noted.  Isthmus  Thickness: 4 mm in thickness. No nodules visualized.  Lymphadenopathy  None visualized.  IMPRESSION:  2.4 cm primarily solid nodule in the right mid lobe of thyroid.  Findings meet consensus criteria for biopsy. Ultrasound-guided fine  needle aspiration should be considered, as per the consensus  statement: Management of Thyroid Nodules Detected at [place]: Society of  Radiologists in Ultrasound Consensus Conference Statement. Radiology  2005; [id number].  Electronically Signed  By: Dwyane Dee M.D.  On: 12/06/2013 17:29   Will d/w pt to obtain a Bx of the nodule.  12/19/2013 Bx results still pending. I will addend the results when they become available.

## 2013-11-30 LAB — T3, FREE: T3, Free: 2.4 pg/mL (ref 2.3–4.2)

## 2013-11-30 LAB — TSH: TSH: 0.56 u[IU]/mL (ref 0.35–4.50)

## 2013-11-30 LAB — T4, FREE: Free T4: 1.1 ng/dL (ref 0.60–1.60)

## 2013-12-05 ENCOUNTER — Telehealth: Payer: Self-pay | Admitting: *Deleted

## 2013-12-05 ENCOUNTER — Encounter: Payer: Self-pay | Admitting: *Deleted

## 2013-12-05 NOTE — Telephone Encounter (Signed)
Contacted [facility]. Thyroid U/S scheduled for 5/21 at 4:15. Called and spoke with pt's husband. He is aware of appt. Interpreter is scheduled to be there as well. Be advised.

## 2013-12-06 ENCOUNTER — Ambulatory Visit
Admission: RE | Admit: 2013-12-06 | Discharge: 2013-12-06 | Disposition: A | Discharge status: Home / Self Care | Payer: BC Managed Care – PPO | Source: Ambulatory Visit | Attending: Internal Medicine | Admitting: Internal Medicine

## 2013-12-12 ENCOUNTER — Encounter: Payer: Self-pay | Admitting: *Deleted

## 2013-12-28 ENCOUNTER — Emergency Department (HOSPITAL_COMMUNITY)
Admission: EM | Admit: 2013-12-28 | Discharge: 2013-12-28 | Disposition: A | Discharge status: Home / Self Care | Payer: BC Managed Care – PPO | Attending: Emergency Medicine | Admitting: Emergency Medicine

## 2013-12-28 ENCOUNTER — Encounter (HOSPITAL_COMMUNITY): Payer: Self-pay | Admitting: Emergency Medicine

## 2013-12-28 DIAGNOSIS — Z791 Long term (current) use of non-steroidal anti-inflammatories (NSAID): Secondary | ICD-10-CM | POA: Insufficient documentation

## 2013-12-28 DIAGNOSIS — M2550 Pain in unspecified joint: Secondary | ICD-10-CM | POA: Insufficient documentation

## 2013-12-28 DIAGNOSIS — M2540 Effusion, unspecified joint: Secondary | ICD-10-CM | POA: Insufficient documentation

## 2013-12-28 HISTORY — DX: Unspecified osteoarthritis, unspecified site: M19.90

## 2013-12-28 MED ORDER — OXYCODONE-ACETAMINOPHEN 5-325 MG PO TABS: 1.0000 | ORAL_TABLET | Freq: Four times a day (QID) | ORAL | Status: DC | PRN

## 2013-12-28 MED ORDER — PREDNISONE 20 MG PO TABS
60.0000 mg | ORAL_TABLET | Freq: Once | ORAL | Status: AC
  Administered 2013-12-28: 60 mg via ORAL
  Filled 2013-12-28: qty 3

## 2013-12-28 MED ORDER — OXYCODONE-ACETAMINOPHEN 5-325 MG PO TABS
2.0000 | ORAL_TABLET | Freq: Once | ORAL | Status: AC
  Administered 2013-12-28: 2 via ORAL
  Filled 2013-12-28: qty 2

## 2013-12-28 MED ORDER — PREDNISONE 20 MG PO TABS: 40.0000 mg | ORAL_TABLET | Freq: Every day | ORAL | Status: DC

## 2013-12-28 NOTE — ED Notes (Signed)
Per pt's family, states has arthritis and was seen by RA MD and was given some meds but they are not working-pain all over but increased on left side

## 2013-12-28 NOTE — Discharge Instructions (Signed)
You may continue to take meloxicam (Mobic) daily as this is an antiinflammatory medication.   Flexeril (muscle relaxer) and percocet (pain medication) can cause drowsiness, do not drive while taking. Be sure to complete all of your prednisone and follow up with your primary care provider and rheumatologist as needed for continued joint pain.   Arthralgia Arthralgia is joint pain. A joint is a place where two bones meet. Joint pain can happen for many reasons. The joint can be bruised, stiff, infected, or weak from aging. Pain usually goes away after resting and taking medicine for soreness.  HOME CARE  Rest the joint as told by your doctor.  Keep the sore joint raised (elevated) for the first 24 hours.  Put ice on the joint area.  Put ice in a plastic bag.  Place a towel between your skin and the bag.  Leave the ice on for 15-20 minutes, 03-04 times a day.  Wear your splint, casting, elastic bandage, or sling as told by your doctor.  Only take medicine as told by your doctor. Do not take aspirin.  Use crutches as told by your doctor. Do not put weight on the joint until told to by your doctor. GET HELP RIGHT AWAY IF:   You have bruising, puffiness (swelling), or more pain.  Your fingers or toes turn blue or start to lose feeling (numb).  Your medicine does not lessen the pain.  Your pain becomes severe.  You have a temperature by mouth above 102 F (38.9 C), not controlled by medicine.  You cannot move or use the joint. MAKE SURE YOU:   Understand these instructions.  Will watch your condition.  Will get help right away if you are not doing well or get worse. Document Released: 06/23/2009 Document Revised: 09/27/2011 Document Reviewed: 06/23/2009 [facility] Patient Information 2014 [place], [place].

## 2013-12-28 NOTE — ED Provider Notes (Signed)
CSN: [id number]     Arrival date & time 12/28/13  1751 History   First MD Initiated Contact with Patient 12/28/13 1010     Chief Complaint  Patient presents with  . Arthritis     (Consider location/radiation/quality/duration/timing/severity/associated sxs/prior Treatment) HPI Pt is a 58yo female with hx of arthritis brought to ED by her daughter c/o diffuse joint pain that started about 2 months ago, worsening over the last 1 week. Hx and translation is provided by pt's adult daughter.  Pt was given mobic and flexeril by her RA MD last week but states they do not help. Pain is constant, sharp, worse with movement, 10/10.  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  Denies specific injuries. Denies fever, n/v/d. No hx of gout.   Past Medical History  Diagnosis Date  . Arthritis    History reviewed. No pertinent past surgical history. Family History  Problem Relation Age of Onset  . Asthma Daughter    History  Substance Use Topics  . Smoking status: Never Smoker   . Smokeless tobacco: Never Used  . Alcohol Use: Yes   OB History   Grav Para Term Preterm Abortions TAB SAB Ect Mult Living   2 2 2       2      Review of Systems  Constitutional: Negative for fever, chills and fatigue.  Respiratory: Negative for cough and shortness of breath.   Cardiovascular: Negative for chest pain and palpitations.  Gastrointestinal: Negative for nausea, vomiting and diarrhea.  Musculoskeletal: Positive for arthralgias and joint swelling. Negative for gait problem, myalgias, neck pain and neck stiffness.  Skin: Negative for color change and wound.  All other systems reviewed and are negative.     Allergies  Review of patient's allergies indicates no known allergies.  Home Medications   Prior to Admission medications   Medication Sig Start Date End Date Taking? Authorizing Provider  cyclobenzaprine  (FLEXERIL) 10 MG tablet Take 1 tablet (10 mg total) by mouth at bedtime. 11/10/13   Chelle S Jeffery, PA-C  meloxicam (MOBIC) 15 MG tablet Take 1 tablet (15 mg total) by mouth daily. 11/10/13   Chelle 11/12/13, PA-C  oxyCODONE-acetaminophen (PERCOCET/ROXICET) 5-325 MG per tablet Take 1-2 tablets by mouth every 6 (six) hours as needed for moderate pain or severe pain. 12/28/13   02/27/14, PA-C  predniSONE (DELTASONE) 20 MG tablet Take 2 tablets (40 mg total) by mouth daily. 12/28/13   02/27/14, PA-C   BP 140/79  Pulse 75  Temp(Src) 97.8 F (36.6 C) (Oral)  Resp 18  SpO2 95% Physical Exam  Nursing note and vitals reviewed. Constitutional: She appears well-developed and well-nourished. No distress.  HENT:  Head: Normocephalic and atraumatic.  Eyes: Conjunctivae are normal. No scleral icterus.  Neck: Normal range of motion.  Cardiovascular: Normal rate, regular rhythm, normal heart sounds and intact distal pulses.   Pulmonary/Chest: Effort normal and breath sounds normal. No respiratory distress. She has no wheezes. She has no rales. She exhibits no tenderness.  Abdominal: Soft. Bowel sounds are normal. She exhibits no distension and no mass. There is no tenderness. There is no rebound and no guarding.  Musculoskeletal: Normal range of motion. She exhibits edema and tenderness.  Mild edema to left hand, bilateral knees, and ankles. FROM right hand, decreased ROM of left hand due to pain.  Decreased plantarflexion and dorsiflexion due to pain.  Increased pain with full knee flexion bilaterally.    Neurological: She is alert.  Skin: Skin is warm and dry. No rash noted. She is not diaphoretic. No erythema.  Skin in tact. No ecchymosis, erythema, or warmth. No red streaking, induration, or evidence of underlying infection    ED Course  Procedures (including critical care time) Labs Review Labs Reviewed - No data to display  Imaging Review No results found.   EKG  Interpretation None      MDM   Final diagnoses:  Arthralgia    Pt presenting to ED c/o diffuse joint pain and swelling. She is followed by an rheumatologist. Vitals: WNL, no evidence of underlying infection or gout. Will place pt on 1 week dose of prednisone to help with joint swelling as well as add percocet for pt's 10/10 pain. Advised to f/u with rheumatologist and PCP. Daughter translated tx with pt who agreed with plan. All questions and concerns addressed.      Junius Finner, PA-C 12/28/13 1049

## 2013-12-29 NOTE — ED Provider Notes (Signed)
Medical screening examination/treatment/procedure(s) were performed by non-physician practitioner and as supervising physician I was immediately available for consultation/collaboration.  Toy Baker, MD 12/29/13 [phone]

## 2014-01-13 ENCOUNTER — Encounter (HOSPITAL_COMMUNITY): Payer: Self-pay | Admitting: Emergency Medicine

## 2014-01-13 ENCOUNTER — Emergency Department (HOSPITAL_COMMUNITY)
Admission: EM | Admit: 2014-01-13 | Discharge: 2014-01-14 | Disposition: A | Discharge status: Home / Self Care | Payer: BC Managed Care – PPO | Attending: Emergency Medicine | Admitting: Emergency Medicine

## 2014-01-13 DIAGNOSIS — IMO0002 Reserved for concepts with insufficient information to code with codable children: Secondary | ICD-10-CM | POA: Insufficient documentation

## 2014-01-13 DIAGNOSIS — M129 Arthropathy, unspecified: Secondary | ICD-10-CM | POA: Insufficient documentation

## 2014-01-13 DIAGNOSIS — Z79899 Other long term (current) drug therapy: Secondary | ICD-10-CM | POA: Insufficient documentation

## 2014-01-13 DIAGNOSIS — M2550 Pain in unspecified joint: Secondary | ICD-10-CM

## 2014-01-13 MED ORDER — OXYCODONE-ACETAMINOPHEN 5-325 MG PO TABS
2.0000 | ORAL_TABLET | Freq: Once | ORAL | Status: AC
  Administered 2014-01-13: 2 via ORAL
  Filled 2014-01-13: qty 2

## 2014-01-13 MED ORDER — KETOROLAC TROMETHAMINE 60 MG/2ML IM SOLN
60.0000 mg | Freq: Once | INTRAMUSCULAR | Status: AC
  Administered 2014-01-13: 60 mg via INTRAMUSCULAR
  Filled 2014-01-13: qty 2

## 2014-01-13 NOTE — ED Notes (Signed)
Per pt's family, pt started complaining of generalized pain that started today, no vomiting or diarrhea

## 2014-01-13 NOTE — ED Provider Notes (Signed)
CSN: [id number]     Arrival date & time 01/13/14  1916 History   First MD Initiated Contact with Patient 01/13/14 2132     Chief Complaint  Patient presents with  . Pain    Patient is complaining of pain in right side and mostly in the joints.    Family used for translation as she speaks [place]  HPI Patient presents to the emergency department with ongoing generalized pain over the past 24 hours.  She states this involves the majority of her joints.  No fevers or chills.  No chest pain shortness of breath.  She states she's had similar symptoms to this before.  She's never seen a rheumatologist.  She states that she was recently seen emergency room and was prescribed Percocet which helped her pain but she ran out yesterday.   Past Medical History  Diagnosis Date  . Arthritis    History reviewed. No pertinent past surgical history. Family History  Problem Relation Age of Onset  . Asthma Daughter    History  Substance Use Topics  . Smoking status: Never Smoker   . Smokeless tobacco: Never Used  . Alcohol Use: Yes   OB History   Grav Para Term Preterm Abortions TAB SAB Ect Mult Living   2 2 2       2      Review of Systems  All other systems reviewed and are negative.     Allergies  Review of patient's allergies indicates no known allergies.  Home Medications   Prior to Admission medications   Medication Sig Start Date End Date Taking? Authorizing Lenor Provencher  diclofenac (VOLTAREN) 75 MG EC tablet Take 75 mg by mouth 2 (two) times daily.   Yes Historical Lucila Klecka, MD  methocarbamol (ROBAXIN) 500 MG tablet Take 500 mg by mouth every 6 (six) hours as needed for muscle spasms.    Yes Historical Emerita Berkemeier, MD  oxyCODONE-acetaminophen (PERCOCET/ROXICET) 5-325 MG per tablet Take 1-2 tablets by mouth every 6 (six) hours as needed for moderate pain or severe pain.   Yes Historical Tenika Keeran, MD  predniSONE (DELTASONE) 20 MG tablet Take 40 mg by mouth daily with breakfast.   Yes  Historical Kamill Fulbright, MD   BP 157/80  Pulse 92  Temp(Src) 98 F (36.7 C) (Oral)  Resp 30  SpO2 99% Physical Exam  Nursing note and vitals reviewed. Constitutional: She is oriented to person, place, and time. She appears well-developed and well-nourished. No distress.  HENT:  Head: Normocephalic and atraumatic.  Eyes: EOM are normal.  Neck: Normal range of motion.  Cardiovascular: Normal rate, regular rhythm and normal heart sounds.   Pulmonary/Chest: Effort normal and breath sounds normal.  Abdominal: Soft. She exhibits no distension. There is no tenderness.  Musculoskeletal: Normal range of motion.  Pain with range of motion of the majority of all of her joints.  She has no significant erythema or warmth or swelling of any of her joints.  Neurological: She is alert and oriented to person, place, and time.  Skin: Skin is warm and dry.  Psychiatric: She has a normal mood and affect. Judgment normal.    ED Course  Procedures (including critical care time) Labs Review Labs Reviewed - No data to display  Imaging Review No results found.   EKG Interpretation None      MDM   Final diagnoses:  Arthralgia    Diffuse arthralgias.  Her current issue.  The patient wanted to see her primary care physician and a rheumatologist  Medical Screening Examination completed and feel no Emergency Medical Condition present.     Lyanne Co, MD 01/14/14 0005

## 2014-01-14 MED ORDER — OXYCODONE-ACETAMINOPHEN 5-325 MG PO TABS: 1.0000 | ORAL_TABLET | ORAL | Status: DC | PRN

## 2014-01-27 ENCOUNTER — Emergency Department (HOSPITAL_COMMUNITY)
Admission: EM | Admit: 2014-01-27 | Discharge: 2014-01-28 | Disposition: A | Discharge status: Home / Self Care | Payer: BC Managed Care – PPO | Attending: Emergency Medicine | Admitting: Emergency Medicine

## 2014-01-27 ENCOUNTER — Encounter (HOSPITAL_COMMUNITY): Payer: Self-pay | Admitting: Emergency Medicine

## 2014-01-27 DIAGNOSIS — M1990 Unspecified osteoarthritis, unspecified site: Secondary | ICD-10-CM

## 2014-01-27 DIAGNOSIS — E041 Nontoxic single thyroid nodule: Secondary | ICD-10-CM | POA: Insufficient documentation

## 2014-01-27 DIAGNOSIS — Z79899 Other long term (current) drug therapy: Secondary | ICD-10-CM | POA: Insufficient documentation

## 2014-01-27 DIAGNOSIS — R768 Other specified abnormal immunological findings in serum: Secondary | ICD-10-CM

## 2014-01-27 DIAGNOSIS — Z791 Long term (current) use of non-steroidal anti-inflammatories (NSAID): Secondary | ICD-10-CM | POA: Insufficient documentation

## 2014-01-27 DIAGNOSIS — M129 Arthropathy, unspecified: Secondary | ICD-10-CM | POA: Insufficient documentation

## 2014-01-27 DIAGNOSIS — R894 Abnormal immunological findings in specimens from other organs, systems and tissues: Secondary | ICD-10-CM | POA: Insufficient documentation

## 2014-01-27 NOTE — ED Notes (Signed)
Per PETAR pt has hx of Arthritis dx 6 months agon; c/o generalized body aches 10/10; Pt takes prednisone and took prescribed dose for the day with no relief. Pt speaks [place].

## 2014-01-28 ENCOUNTER — Ambulatory Visit (INDEPENDENT_AMBULATORY_CARE_PROVIDER_SITE_OTHER): Payer: BC Managed Care – PPO | Admitting: Emergency Medicine

## 2014-01-28 ENCOUNTER — Telehealth: Payer: Self-pay | Admitting: Radiology

## 2014-01-28 DIAGNOSIS — M069 Rheumatoid arthritis, unspecified: Secondary | ICD-10-CM

## 2014-01-28 LAB — URINALYSIS, ROUTINE W REFLEX MICROSCOPIC
BILIRUBIN URINE: NEGATIVE
Glucose, UA: NEGATIVE mg/dL
Hgb urine dipstick: NEGATIVE
Ketones, ur: NEGATIVE mg/dL
Nitrite: NEGATIVE
Protein, ur: NEGATIVE mg/dL
Specific Gravity, Urine: 1.018 (ref 1.005–1.030)
Urobilinogen, UA: 0.2 mg/dL (ref 0.0–1.0)
pH: 8.5 — ABNORMAL HIGH (ref 5.0–8.0)

## 2014-01-28 LAB — CBC WITH DIFFERENTIAL/PLATELET
BASOS PCT: 0 % (ref 0–1)
Basophils Absolute: 0 10*3/uL (ref 0.0–0.1)
EOS PCT: 1 % (ref 0–5)
Eosinophils Absolute: 0.1 10*3/uL (ref 0.0–0.7)
HCT: 35 % — ABNORMAL LOW (ref 36.0–46.0)
HEMOGLOBIN: 11.2 g/dL — AB (ref 12.0–15.0)
LYMPHS ABS: 4.8 10*3/uL — AB (ref 0.7–4.0)
Lymphocytes Relative: 32 % (ref 12–46)
MCH: 22.4 pg — AB (ref 26.0–34.0)
MCHC: 32 g/dL (ref 30.0–36.0)
MCV: 69.9 fL — AB (ref 78.0–100.0)
MONO ABS: 1.3 10*3/uL — AB (ref 0.1–1.0)
Monocytes Relative: 9 % (ref 3–12)
NEUTROS ABS: 8.7 10*3/uL — AB (ref 1.7–7.7)
Neutrophils Relative %: 58 % (ref 43–77)
Platelets: 454 10*3/uL — ABNORMAL HIGH (ref 150–400)
RBC: 5.01 MIL/uL (ref 3.87–5.11)
RDW: 14.6 % (ref 11.5–15.5)
WBC: 14.9 10*3/uL — ABNORMAL HIGH (ref 4.0–10.5)

## 2014-01-28 LAB — URINE MICROSCOPIC-ADD ON

## 2014-01-28 LAB — COMPREHENSIVE METABOLIC PANEL
ALK PHOS: 100 U/L (ref 39–117)
ALT: 20 U/L (ref 0–35)
ANION GAP: 15 (ref 5–15)
AST: 12 U/L (ref 0–37)
Albumin: 2.9 g/dL — ABNORMAL LOW (ref 3.5–5.2)
BUN: 17 mg/dL (ref 6–23)
CALCIUM: 8.4 mg/dL (ref 8.4–10.5)
CO2: 23 mEq/L (ref 19–32)
Chloride: 98 mEq/L (ref 96–112)
Creatinine, Ser: 0.63 mg/dL (ref 0.50–1.10)
GFR calc Af Amer: >90 mL/min (ref >90)
GFR calc non Af Amer: >90 mL/min (ref >90)
GLUCOSE: 142 mg/dL — AB (ref 70–99)
POTASSIUM: 3.6 meq/L — AB (ref 3.7–5.3)
Sodium: 136 mEq/L — ABNORMAL LOW (ref 137–147)
Total Bilirubin: 1 mg/dL (ref 0.3–1.2)
Total Protein: 6.6 g/dL (ref 6.0–8.3)

## 2014-01-28 MED ORDER — ONDANSETRON HCL 4 MG/2ML IJ SOLN
4.0000 mg | Freq: Once | INTRAMUSCULAR | Status: AC
  Administered 2014-01-28: 4 mg via INTRAVENOUS
  Filled 2014-01-28: qty 2

## 2014-01-28 MED ORDER — HYDROMORPHONE HCL PF 1 MG/ML IJ SOLN
1.0000 mg | Freq: Once | INTRAMUSCULAR | Status: AC
  Administered 2014-01-28: 1 mg via INTRAVENOUS
  Filled 2014-01-28: qty 1

## 2014-01-28 MED ORDER — HYDROMORPHONE HCL 4 MG PO TABS: 4.0000 mg | ORAL_TABLET | ORAL | Status: DC | PRN

## 2014-01-28 MED ORDER — OXYCODONE-ACETAMINOPHEN 7.5-325 MG PO TABS
1.0000 | ORAL_TABLET | ORAL | Status: DC | PRN
Start: 2014-01-28 — End: 2014-02-04

## 2014-01-28 MED ORDER — PREDNISONE 20 MG PO TABS: ORAL_TABLET | ORAL | Status: DC

## 2014-01-28 NOTE — Patient Instructions (Signed)
DO NOT RUN OUT OF PREDNISONE    Vim kh?p d?ng th?p (Rheumatoid Arthritis) Vim kh?p d?ng th?p l vim ko di (m?n tnh) gy ?au, s?ng v c?ng ? cc kh?p. B?nh c th? ?nh h??ng ??n ton b? c? th?, bao g?m c? m?t v ph?i. Nh?ng ?nh h??ng c?a vim kh?p d?ng th?p r?t khc nhau trong s? nh?ng ng??i b? b?nh ny. NGUYN NHN  Nguyn nhn c?a vim kh?p d?ng th?p khng ???c xc ??nh. B?nh c xu h??ng x?y ra v?i nhi?u gia ?nh v ph? bi?n h?n ? ph? n?. M?t s? t? bo nh?t ??nh c?a h? th?ng phng v? t? nhin c?a c? th? (h? th?ng mi?n d?ch) khng lm vi?c ?ng cch v b?t ??u t?n cng cc kh?p kh?e m?nh. Ch? y?u lin quan ??n m lin k?t lt cc kh?p (mng ho?t d?ch). ?i?u ny c th? gy t?n th??ng kh?p. TRI?U CH?NG   ?au, c?ng, s?ng v gi?m c? ??ng c?a cc kh?p, ??c bi?t l ? bn tay v bn chn.  ?? c?ng tr?m tr?ng h?n vo bu?i sng. C th? ko di 1 - 2 ti?ng ho?c lu h?n.  T v ?au bu?t ? bn tay.  M?t m?i.  ?n khng ngon mi?ng.  S?t cn.  S?t nh?[place] m?t v mi?ng.  Cc c?c r?n (c?c kh?p d?ng th?p) pht tri?n d??i da ? nh?ng vng nh? khu?u tay v bn tay. CH?N ?ON  Ch?n ?on d?a vo cc tri?u ch?ng ???c m t?, th?m khm v xt nghi?m mu. ?i khi c?n ch?p X quang. ?I?U TR?  M?c tiu ?i?u tr? l gi?m ?au, gi?m vim v lm ch?m ho?c ng?ng t?n th??ng kh?p v tn t?t. Cc ph??ng php khc nhau v c th? bao g?m:  Duy tr s? cn b?ng gi?a ngh? ng?i, t?p luy?n v dinh d??ng h?p l.  Thu?c:  Thu?c gi?m ?au (gi?m ?au).  Corticosteroid v cc thu?c ch?ng vim khng c steroid (NSAID) ?? gi?m vim.  Nh?ng thu?c ch?ng th?p kh?p lm gi?m b?nh (DMARD) ?? c? g?ng lm ch?m ti?n tri?n c?a b?nh.  Thu?c ?? thay ??i ?p ?ng sinh h?c ?? gi?m vim v t?n th??ng.  Li?u php v?t l v li?u php ngh? nghi?p.  Ph?u thu?t cho b?nh nhn b? t?n th??ng kh?p n?ng. C th? c?n thay kh?p ho?c k?t h?p cc kh?p.  Th??ng xuyn theo di v ch?m [place] lin t?c, ch?ng h?n nh? ?i khm, xt nghi?m mu v n??c  ti?u, ch?p X quang. H??NG D?N CH?M [place] T?I NH   Duy tr v?n ??ng c? th? v gi?m ho?t ??ng khi b?nh n?ng h?n.  p d?ng ch? ?? ?n u?ng cn ??i.  Ch??m nng vo cc kh?p b? ?nh h??ng khi qu v? th?c d?y v tr??c khi ho?t ??ng. Ch??m nng trn kh?p b? ?nh h??ng trong kho?ng th?i gian theo ch? d?n c?a chuyn gia ch?m [place] s?c kh?e.  Ch??m ? l?nh vo cc kh?p b? ?nh h??ng sau khi ho?t ??ng ho?c t?p th? d?c.  Cho ? l?nh vo ti nh?a.  ?? kh?n t?m vo gi?a da v ti.  ?? ? l?nh trong kho?ng 15-20 pht, 3-4 l?n m?i ngy, ho?c theo ch? d?n c?a chuyn gia ch?m [place] s?c kh?e c?a qu v?.  S? d?ng t?t c? thu?c v th?c ph?m ch?c n?ng theo h??ng d?n c?a chuyn gia ch?m [place] s?c kh?e.  S? d?ng n?p theo ch? d?n c?a chuyn gia ch?m  s?c kh?e. N?p gip duy  tr v? tr v ch?c n?ng c?a kh?p.  Khng ??t g?i d??i ??u g?i trong khi ng?. Lm nh? v?y c th? d?n ??n co th?t.  Tham gia vo m?t ch??ng trnh t? qu?n l ?? c?p nh?t cch ?i?u tr? v k? n?ng ??i ph m?i nh?t. NGAY L?P T?C ?I KHM N?U:  Qu v? b? ng?t x?u.  Qu v? c nh?ng lc c?c k? y?u.  Qu v? [employer] b? nng, ?au ? kh?p, v n?ng h?n ?au nh?c kh?p bnh th??ng.  Qu v? b? ?n l?nh.  Qu v? b? s?t. ??M B?O QU V?:  Hi?u r cc h??ng d?n ny.  S? theo di tnh tr?ng c?a mnh.  S? yu c?u tr? gip ngay l?p t?c n?u qu v? c?m th?y khng kh?e ho?c th?y tr?m tr?ng h?n. ?? BI?T THM THNG TIN  American College of Rheumatology (Tr??ng ?o t?o v? Th?p kh?p M?): www.rheumatology.org Arthritis Foundation (T? Ch?c Vim Kh?p): www.arthritis.org Document Released: 07/05/2005 Document Revised: 07/10/2013 [facility] Patient Information 2015 [place], [place]. This information is not intended to replace advice given to you by your health care provider. Make sure you discuss any questions you have with your health care provider.

## 2014-01-28 NOTE — Telephone Encounter (Signed)
Is there any way to move up [name]'s appointment to Rheumotology ?  She is in a great deal of pain and had to come in tonight (also she went to the ED last night due to pain)  She has an appointment 7/28.  Dr Dareen Piano would like this moved up if at all possible to sometime this month.

## 2014-01-28 NOTE — ED Provider Notes (Signed)
CSN: [id number]     Arrival date & time 01/27/14  2057 History   First MD Initiated Contact with Patient 01/28/14 0031     Chief Complaint  Patient presents with  . Arthritis     (Consider location/radiation/quality/duration/timing/severity/associated sxs/prior Treatment) Patient is a 58 y.o. female presenting with arthritis. The history is provided by the patient. A language interpreter was used.  Arthritis  She has been having pains in her elbows, wrists, knees, ankles for about the last 1.5 years but symptoms have been getting much worse recently. She has noted some swelling in her knees and in her fingers. She saw her PCP who put her on meloxicam and prednisone with little improvement. She's also been given prescriptions for oxycodone-acetaminophen which does give temporary relief but pain recurs. Pain seems to be worse with activity and movement. Nothing makes it better other the narcotic painkillers. She denies fever, rash, chest pain, dyspnea, nausea, weight loss. Pain is rated at 10/10.  Past Medical History  Diagnosis Date  . Arthritis    History reviewed. No pertinent past surgical history. Family History  Problem Relation Age of Onset  . Asthma Daughter    History  Substance Use Topics  . Smoking status: Never Smoker   . Smokeless tobacco: Never Used  . Alcohol Use: No   OB History   Grav Para Term Preterm Abortions TAB SAB Ect Mult Living   2 2 2       2      Review of Systems  Musculoskeletal: Positive for arthritis.  All other systems reviewed and are negative.     Allergies  Review of patient's allergies indicates no known allergies.  Home Medications   Prior to Admission medications   Medication Sig Start Date End Date Taking? Authorizing Provider  diclofenac (VOLTAREN) 75 MG EC tablet Take 75 mg by mouth 2 (two) times daily.   Yes Historical Provider, MD  methocarbamol (ROBAXIN) 500 MG tablet Take 500 mg by mouth every 6 (six) hours as needed for  muscle spasms.    Yes Historical Provider, MD  predniSONE (DELTASONE) 20 MG tablet Take 40 mg by mouth daily with breakfast.   Yes Historical Provider, MD  oxyCODONE-acetaminophen (PERCOCET/ROXICET) 5-325 MG per tablet Take 1-2 tablets by mouth every 6 (six) hours as needed for moderate pain or severe pain.    Historical Provider, MD   BP 126/79  Pulse 87  Temp(Src) 98 F (36.7 C) (Oral)  Resp 19  Ht 4\' 9"  (1.448 m)  Wt 120 lb (54.432 kg)  BMI 25.96 kg/m2  SpO2 92% Physical Exam  Nursing note and vitals reviewed.  58 year old female, who appears to be in pain, but is in no acute distress. Vital signs are normal. Oxygen saturation is 92%, which is normal. Head is normocephalic and atraumatic. PERRLA, EOMI. Oropharynx is clear. Neck is nontender and supple without adenopathy or JVD. Back is nontender and there is no CVA tenderness. Lungs are clear without rales, wheezes, or rhonchi. Chest is nontender. Heart has regular rate and rhythm without murmur. Abdomen is soft, flat, nontender without masses or hepatosplenomegaly and peristalsis is normoactive. Extremities have no cyanosis or pitting edema. Evidence of synovial thickening is present in the wrists and knees. There is pain with passive range of motion at the elbows, wrists, and knees. Mild swelling is noted of the fingers but no definite synovial thickening is seen in the hands. Skin is warm and dry without rash. Neurologic: Mental status is normal,  cranial nerves are intact, there are no motor or sensory deficits.  ED Course  Procedures (including critical care time) Labs Review Results for orders placed during the hospital encounter of 01/27/14  CBC WITH DIFFERENTIAL      Result Value Ref Range   WBC 14.9 (*) 4.0 - 10.5 K/uL   RBC 5.01  3.87 - 5.11 MIL/uL   Hemoglobin 11.2 (*) 12.0 - 15.0 g/dL   HCT 35.5 (*) 73.2 - 20.2 %   MCV 69.9 (*) 78.0 - 100.0 fL   MCH 22.4 (*) 26.0 - 34.0 pg   MCHC 32.0  30.0 - 36.0 g/dL   RDW  54.2  70.6 - 23.7 %   Platelets 454 (*) 150 - 400 K/uL   Neutrophils Relative % 58  43 - 77 %   Lymphocytes Relative 32  12 - 46 %   Monocytes Relative 9  3 - 12 %   Eosinophils Relative 1  0 - 5 %   Basophils Relative 0  0 - 1 %   Neutro Abs 8.7 (*) 1.7 - 7.7 K/uL   Lymphs Abs 4.8 (*) 0.7 - 4.0 K/uL   Monocytes Absolute 1.3 (*) 0.1 - 1.0 K/uL   Eosinophils Absolute 0.1  0.0 - 0.7 K/uL   Basophils Absolute 0.0  0.0 - 0.1 K/uL  COMPREHENSIVE METABOLIC PANEL      Result Value Ref Range   Sodium 136 (*) 137 - 147 mEq/L   Potassium 3.6 (*) 3.7 - 5.3 mEq/L   Chloride 98  96 - 112 mEq/L   CO2 23  19 - 32 mEq/L   Glucose, Bld 142 (*) 70 - 99 mg/dL   BUN 17  6 - 23 mg/dL   Creatinine, Ser 6.28  0.50 - 1.10 mg/dL   Calcium 8.4  8.4 - 31.5 mg/dL   Total Protein 6.6  6.0 - 8.3 g/dL   Albumin 2.9 (*) 3.5 - 5.2 g/dL   AST 12  0 - 37 U/L   ALT 20  0 - 35 U/L   Alkaline Phosphatase 100  39 - 117 U/L   Total Bilirubin 1.0  0.3 - 1.2 mg/dL   GFR calc non Af Amer >90  >90 mL/min   GFR calc Af Amer >90  >90 mL/min   Anion gap 15  5 - 15  URINALYSIS, ROUTINE W REFLEX MICROSCOPIC      Result Value Ref Range   Color, Urine YELLOW  YELLOW   APPearance CLEAR  CLEAR   Specific Gravity, Urine 1.018  1.005 - 1.030   pH 8.5 (*) 5.0 - 8.0   Glucose, UA NEGATIVE  NEGATIVE mg/dL   Hgb urine dipstick NEGATIVE  NEGATIVE   Bilirubin Urine NEGATIVE  NEGATIVE   Ketones, ur NEGATIVE  NEGATIVE mg/dL   Protein, ur NEGATIVE  NEGATIVE mg/dL   Urobilinogen, UA 0.2  0.0 - 1.0 mg/dL   Nitrite NEGATIVE  NEGATIVE   Leukocytes, UA SMALL (*) NEGATIVE  URINE MICROSCOPIC-ADD ON      Result Value Ref Range   Squamous Epithelial / LPF FEW (*) RARE   WBC, UA 7-10  <3 WBC/hpf   Bacteria, UA RARE  RARE   MDM   Final diagnoses:  Arthritis  Elevated rheumatoid factor  Thyroid nodule    Arthritis involving elbows, wrists, knees, ankles, fingers. Old records are reviewed and as she recently was seen by an  endocrinologist because of evidence of hyper thyroidism but repeat thyroid tests are actually normal.  She had a thyroid ultrasound which did find a 2.4 centimeter nodule with recommendation for fine-needle aspiration. This has not been a set up yet. She also had ANA and rheumatoid factor drawn at an office visit April 26 and rheumatoid factor was significantly elevated. ANA was negative. At this point, it seems that she has some variant on rheumatoid arthritis, but will need further evaluation by rheumatologist. She has not received significant relief from courses of prednisone, so prednisone will be given at this point. I will recheck CBC and metabolic panel as well as sedimentation rate and she will be treated symptomatically with narcotics with cool of referral to a rheumatologist as promptly as possible. This will need to be set up through her PCP. She will also need referral for fine-needle aspiration of her thyroid nodule.  ED evaluation is unremarkable. She got good relief of pain from hydromorphone. She is discharged with prescription for acetaminophen-oxycodone. This is just for symptomatic relief until she can see a rheumatologist to get a definitive diagnosis made. She is also advised to discuss with her PCP or endocrinologist the need for fine-needle aspiration of her thyroid nodule  Dione Booze, MD 01/28/14 [phone]

## 2014-01-28 NOTE — Progress Notes (Signed)
[facility] [address], [place] [address] [phone]  Date:  01/28/2014   Name:  Mandy Williams   DOB:  [birth year]   MRN:  [id number]  PCP:  Mandy Williams,CHELLE, PA-C    Chief Complaint: No chief complaint on file.   History of Present Illness:  Mandy Williams is a 58 y.o. very pleasant female patient who presents with the following:  Patient has a history of widespread joint pains over a year or so.  She has been here and the ER several times over the past several months.  She was seen in the ER yesterday and had pain relief with IV dilaudid.  She was noted to have an elevated sed rate and a very high RA factor.  She has intermittently treated with prednisone. Seen yesterday in the ER and discharged with percocet which is said to give her no relief.  They are requesting an IV dose of medication so she can walk again.  She has an appt next month with rheumatology.  Denies other complaint or health concern today.   Patient Active Problem List   Diagnosis Date Noted  . Thyrotoxicosis 11/29/2013  . Neck pain 11/10/2013  . Ankle pain 11/10/2013  . Hand pain 04/18/2012    Past Medical History  Diagnosis Date  . Arthritis     No past surgical history on file.  History  Substance Use Topics  . Smoking status: Never Smoker   . Smokeless tobacco: Never Used  . Alcohol Use: No    Family History  Problem Relation Age of Onset  . Asthma Daughter     No Known Allergies  Medication list has been reviewed and updated.  Current Outpatient Prescriptions on File Prior to Visit  Medication Sig Dispense Refill  . diclofenac (VOLTAREN) 75 MG EC tablet Take 75 mg by mouth 2 (two) times daily.      . methocarbamol (ROBAXIN) 500 MG tablet Take 500 mg by mouth every 6 (six) hours as needed for muscle spasms.       Marland Kitchen oxyCODONE-acetaminophen (PERCOCET) 7.5-325 MG per tablet Take 1 tablet by mouth every 4 (four) hours as needed for pain.  30 tablet  0  . oxyCODONE-acetaminophen  (PERCOCET/ROXICET) 5-325 MG per tablet Take 1-2 tablets by mouth every 6 (six) hours as needed for moderate pain or severe pain.      . predniSONE (DELTASONE) 20 MG tablet Take 40 mg by mouth daily with breakfast.       No current facility-administered medications on file prior to visit.    Review of Systems:  As per HPI, otherwise negative.    Physical Examination: There were no vitals filed for this visit. There were no vitals filed for this visit. There is no weight on file to calculate BMI. Ideal Body Weight:     GEN: WDWN, marked distress, Non-toxic, Alert & Oriented x 3 HEENT: Atraumatic, Normocephalic.  Ears and Nose: No external deformity. EXTR: No clubbing/cyanosis/edema NEURO: moves only with pain.    PSYCH: Normally interactive. Conversant. Not depressed or anxious appearing.  Calm demeanor.    Assessment and Plan: RA Dilaudid Prednisone Will try and get her in with rheumatology sooner   Signed,  Phillips Odor, MD

## 2014-01-28 NOTE — Discharge Instructions (Signed)
Your pain is from some type of arthritis. You will need additional testing to find out exactly what type of arthritis so the appropriate treatment can be started. Please see your PCP as soon as possible to arrange for a referral to a rheumatologist(and arthritis doctor).  You recently had an ultrasound of your thyroid gland which showed a nodule. You need to have an additional tests on that nodule. Your PCP or your endocrinologist can set this up.  Arthritis, Nonspecific Arthritis is inflammation of a joint. This usually means pain, redness, warmth or swelling are present. One or more joints may be involved. There are a number of types of arthritis. Your caregiver may not be able to tell what type of arthritis you have right away. CAUSES  The most common cause of arthritis is the wear and tear on the joint (osteoarthritis). This causes damage to the cartilage, which can break down over time. The knees, hips, back and neck are most often affected by this type of arthritis. Other types of arthritis and common causes of joint pain include:  Sprains and other injuries near the joint. Sometimes minor sprains and injuries cause pain and swelling that develop hours later.  Rheumatoid arthritis. This affects hands, feet and knees. It usually affects both sides of your body at the same time. It is often associated with chronic ailments, fever, weight loss and general weakness.  Crystal arthritis. Gout and pseudo gout can cause occasional acute severe pain, redness and swelling in the foot, ankle, or knee.  Infectious arthritis. Bacteria can get into a joint through a break in overlying skin. This can cause infection of the joint. Bacteria and viruses can also spread through the blood and affect your joints.  Drug, infectious and allergy reactions. Sometimes joints can become mildly painful and slightly swollen with these types of illnesses. SYMPTOMS   Pain is the main symptom.  Your joint or joints can  also be red, swollen and warm or hot to the touch.  You may have a fever with certain types of arthritis, or even feel overall ill.  The joint with arthritis will hurt with movement. Stiffness is present with some types of arthritis. DIAGNOSIS  Your caregiver will suspect arthritis based on your description of your symptoms and on your exam. Testing may be needed to find the type of arthritis:  Blood and sometimes urine tests.  X-ray tests and sometimes CT or MRI scans.  Removal of fluid from the joint (arthrocentesis) is done to check for bacteria, crystals or other causes. Your caregiver (or a specialist) will numb the area over the joint with a local anesthetic, and use a needle to remove joint fluid for examination. This procedure is only minimally uncomfortable.  Even with these tests, your caregiver may not be able to tell what kind of arthritis you have. Consultation with a specialist (rheumatologist) may be helpful. TREATMENT  Your caregiver will discuss with you treatment specific to your type of arthritis. If the specific type cannot be determined, then the following general recommendations may apply. Treatment of severe joint pain includes:  Rest.  Elevation.  Anti-inflammatory medication (for example, ibuprofen) may be prescribed. Avoiding activities that cause increased pain.  Only take over-the-counter or prescription medicines for pain and discomfort as recommended by your caregiver.  Cold packs over an inflamed joint may be used for 10 to 15 minutes every hour. Hot packs sometimes feel better, but do not use overnight. Do not use hot packs if you are  diabetic without your caregiver's permission.  A cortisone shot into arthritic joints may help reduce pain and swelling.  Any acute arthritis that gets worse over the next 1 to 2 days needs to be looked at to be sure there is no joint infection. Long-term arthritis treatment involves modifying activities and lifestyle to  reduce joint stress jarring. This can include weight loss. Also, exercise is needed to nourish the joint cartilage and remove waste. This helps keep the muscles around the joint strong. HOME CARE INSTRUCTIONS   Do not take aspirin to relieve pain if gout is suspected. This elevates uric acid levels.  Only take over-the-counter or prescription medicines for pain, discomfort or fever as directed by your caregiver.  Rest the joint as much as possible.  If your joint is swollen, keep it elevated.  Use crutches if the painful joint is in your leg.  Drinking plenty of fluids may help for certain types of arthritis.  Follow your caregiver's dietary instructions.  Try low-impact exercise such as:  Swimming.  Water aerobics.  Biking.  Walking.  Morning stiffness is often relieved by a warm shower.  Put your joints through regular range-of-motion. SEEK MEDICAL CARE IF:   You do not feel better in 24 hours or are getting worse.  You have side effects to medications, or are not getting better with treatment. SEEK IMMEDIATE MEDICAL CARE IF:   You have a fever.  You develop severe joint pain, swelling or redness.  Many joints are involved and become painful and swollen.  There is severe back pain and/or leg weakness.  You have loss of bowel or bladder control. Document Released: 08/12/2004 Document Revised: 09/27/2011 Document Reviewed: 08/28/2008 [facility] Patient Information 2015 [place], [place]. This information is not intended to replace advice given to you by your health care provider. Make sure you discuss any questions you have with your health care provider.  Acetaminophen; Oxycodone tablets What is this medicine? ACETAMINOPHEN; OXYCODONE (a set a MEE noe fen; ox i KOE done) is a pain reliever. It is used to treat mild to moderate pain. This medicine may be used for other purposes; ask your health care provider or pharmacist if you have questions. COMMON BRAND NAME(S):  Endocet, Magnacet, Narvox, Percocet, Perloxx, Primalev, Primlev, Roxicet, Xolox What should I tell my health care provider before I take this medicine? They need to know if you have any of these conditions: -brain tumor -Crohn's disease, inflammatory bowel disease, or ulcerative colitis -drug abuse or addiction -head injury -heart or circulation problems -if you often drink alcohol -kidney disease or problems going to the bathroom -liver disease -lung disease, asthma, or breathing problems -an unusual or allergic reaction to acetaminophen, oxycodone, other opioid analgesics, other medicines, foods, dyes, or preservatives -pregnant or trying to get pregnant -breast-feeding How should I use this medicine? Take this medicine by mouth with a full glass of water. Follow the directions on the prescription label. Take your medicine at regular intervals. Do not take your medicine more often than directed. Talk to your pediatrician regarding the use of this medicine in children. Special care may be needed. Patients over 21 years old may have a stronger reaction and need a smaller dose. Overdosage: If you think you have taken too much of this medicine contact a poison control center or emergency room at once. NOTE: This medicine is only for you. Do not share this medicine with others. What if I miss a dose? If you miss a dose, take it as  soon as you can. If it is almost time for your next dose, take only that dose. Do not take double or extra doses. What may interact with this medicine? -alcohol -antihistamines -barbiturates like amobarbital, butalbital, butabarbital, methohexital, pentobarbital, phenobarbital, thiopental, and secobarbital -benztropine -drugs for bladder problems like solifenacin, trospium, oxybutynin, tolterodine, hyoscyamine, and methscopolamine -drugs for breathing problems like ipratropium and tiotropium -drugs for certain stomach or intestine problems like propantheline,  homatropine methylbromide, glycopyrrolate, atropine, belladonna, and dicyclomine -general anesthetics like etomidate, ketamine, nitrous oxide, propofol, desflurane, enflurane, halothane, isoflurane, and sevoflurane -medicines for depression, anxiety, or psychotic disturbances -medicines for sleep -muscle relaxants -naltrexone -narcotic medicines (opiates) for pain -phenothiazines like perphenazine, thioridazine, chlorpromazine, mesoridazine, fluphenazine, prochlorperazine, promazine, and trifluoperazine -scopolamine -tramadol -trihexyphenidyl This list may not describe all possible interactions. Give your health care provider a list of all the medicines, herbs, non-prescription drugs, or dietary supplements you use. Also tell them if you smoke, drink alcohol, or use illegal drugs. Some items may interact with your medicine. What should I watch for while using this medicine? Tell your doctor or health care professional if your pain does not go away, if it gets worse, or if you have new or a different type of pain. You may develop tolerance to the medicine. Tolerance means that you will need a higher dose of the medication for pain relief. Tolerance is normal and is expected if you take this medicine for a long time. Do not suddenly stop taking your medicine because you may develop a severe reaction. Your body becomes used to the medicine. This does NOT mean you are addicted. Addiction is a behavior related to getting and using a drug for a non-medical reason. If you have pain, you have a medical reason to take pain medicine. Your doctor will tell you how much medicine to take. If your doctor wants you to stop the medicine, the dose will be slowly lowered over time to avoid any side effects. You may get drowsy or dizzy. Do not drive, use machinery, or do anything that needs mental alertness until you know how this medicine affects you. Do not stand or sit up quickly, especially if you are an older  patient. This reduces the risk of dizzy or fainting spells. Alcohol may interfere with the effect of this medicine. Avoid alcoholic drinks. There are different types of narcotic medicines (opiates) for pain. If you take more than one type at the same time, you may have more side effects. Give your health care provider a list of all medicines you use. Your doctor will tell you how much medicine to take. Do not take more medicine than directed. Call emergency for help if you have problems breathing. The medicine will cause constipation. Try to have a bowel movement at least every 2 to 3 days. If you do not have a bowel movement for 3 days, call your doctor or health care professional. Do not take Tylenol (acetaminophen) or medicines that have acetaminophen with this medicine. Too much acetaminophen can be very dangerous. Many nonprescription medicines contain acetaminophen. Always read the labels carefully to avoid taking more acetaminophen. What side effects may I notice from receiving this medicine? Side effects that you should report to your doctor or health care professional as soon as possible: -allergic reactions like skin rash, itching or hives, swelling of the face, lips, or tongue -breathing difficulties, wheezing -confusion -light headedness or fainting spells -severe stomach pain -unusually weak or tired -yellowing of the skin or the whites of  the eyes Side effects that usually do not require medical attention (report to your doctor or health care professional if they continue or are bothersome): -dizziness -drowsiness -nausea -vomiting This list may not describe all possible side effects. Call your doctor for medical advice about side effects. You may report side effects to FDA at 1-800-FDA-1088. Where should I keep my medicine? Keep out of the reach of children. This medicine can be abused. Keep your medicine in a safe place to protect it from theft. Do not share this medicine with  anyone. Selling or giving away this medicine is dangerous and against the law. Store at room temperature between 20 and 25 degrees C (68 and 77 degrees F). Keep container tightly closed. Protect from light. This medicine may cause accidental overdose and death if it is taken by other adults, children, or pets. Flush any unused medicine down the toilet to reduce the chance of harm. Do not use the medicine after the expiration date. NOTE: This sheet is a summary. It may not cover all possible information. If you have questions about this medicine, talk to your doctor, pharmacist, or health care provider.  2015, [employer]. (2013-02-26 13:17:35)

## 2014-01-28 NOTE — ED Notes (Signed)
MD at bedside. 

## 2014-01-29 NOTE — Telephone Encounter (Signed)
Referrals has a call to omega at [facility] medical waiting on a call back

## 2014-02-04 ENCOUNTER — Ambulatory Visit (INDEPENDENT_AMBULATORY_CARE_PROVIDER_SITE_OTHER): Payer: BC Managed Care – PPO | Admitting: Emergency Medicine

## 2014-02-04 VITALS — BP 158/80 | HR 77 | Temp 98.3°F | Resp 16 | Ht <= 58 in | Wt 114.0 lb

## 2014-02-04 DIAGNOSIS — M069 Rheumatoid arthritis, unspecified: Secondary | ICD-10-CM

## 2014-02-04 NOTE — Patient Instructions (Signed)
Only take ONE prednisone tablet daily for one week.  Next week only take one-half (1/2) tablet daily until seen by rheumatologist      Vim kh?p d?ng th?p (Rheumatoid Arthritis) Vim kh?p d?ng th?p l vim ko di (m?n tnh) gy ?au, s?ng v c?ng ? cc kh?p. B?nh c th? ?nh h??ng ??n ton b? c? th?, bao g?m c? m?t v ph?i. Nh?ng ?nh h??ng c?a vim kh?p d?ng th?p r?t khc nhau trong s? nh?ng ng??i b? b?nh ny. NGUYN NHN  Nguyn nhn c?a vim kh?p d?ng th?p khng ???c xc ??nh. B?nh c xu h??ng x?y ra v?i nhi?u gia ?nh v ph? bi?n h?n ? ph? n?. M?t s? t? bo nh?t ??nh c?a h? th?ng phng v? t? nhin c?a c? th? (h? th?ng mi?n d?ch) khng lm vi?c ?ng cch v b?t ??u t?n cng cc kh?p kh?e m?nh. Ch? y?u lin quan ??n m lin k?t lt cc kh?p (mng ho?t d?ch). ?i?u ny c th? gy t?n th??ng kh?p. TRI?U CH?NG   ?au, c?ng, s?ng v gi?m c? ??ng c?a cc kh?p, ??c bi?t l ? bn tay v bn chn.  ?? c?ng tr?m tr?ng h?n vo bu?i sng. C th? ko di 1 - 2 ti?ng ho?c lu h?n.  T v ?au bu?t ? bn tay.  M?t m?i.  ?n khng ngon mi?ng.  S?t cn.  S?t nh?[place] m?t v mi?ng.  Cc c?c r?n (c?c kh?p d?ng th?p) pht tri?n d??i da ? nh?ng vng nh? khu?u tay v bn tay. CH?N ?ON  Ch?n ?on d?a vo cc tri?u ch?ng ???c m t?, th?m khm v xt nghi?m mu. ?i khi c?n ch?p X quang. ?I?U TR?  M?c tiu ?i?u tr? l gi?m ?au, gi?m vim v lm ch?m ho?c ng?ng t?n th??ng kh?p v tn t?t. Cc ph??ng php khc nhau v c th? bao g?m:  Duy tr s? cn b?ng gi?a ngh? ng?i, t?p luy?n v dinh d??ng h?p l.  Thu?c:  Thu?c gi?m ?au (gi?m ?au).  Corticosteroid v cc thu?c ch?ng vim khng c steroid (NSAID) ?? gi?m vim.  Nh?ng thu?c ch?ng th?p kh?p lm gi?m b?nh (DMARD) ?? c? g?ng lm ch?m ti?n tri?n c?a b?nh.  Thu?c ?? thay ??i ?p ?ng sinh h?c ?? gi?m vim v t?n th??ng.  Li?u php v?t l v li?u php ngh? nghi?p.  Ph?u thu?t cho b?nh nhn b? t?n th??ng kh?p n?ng. C th? c?n thay kh?p ho?c k?t  h?p cc kh?p.  Th??ng xuyn theo di v ch?m [place] lin t?c, ch?ng h?n nh? ?i khm, xt nghi?m mu v n??c ti?u, ch?p X quang. H??NG D?N CH?M [place] T?I NH   Duy tr v?n ??ng c? th? v gi?m ho?t ??ng khi b?nh n?ng h?n.  p d?ng ch? ?? ?n u?ng cn ??i.  Ch??m nng vo cc kh?p b? ?nh h??ng khi qu v? th?c d?y v tr??c khi ho?t ??ng. Ch??m nng trn kh?p b? ?nh h??ng trong kho?ng th?i gian theo ch? d?n c?a chuyn gia ch?m [place] s?c kh?e.  Ch??m ? l?nh vo cc kh?p b? ?nh h??ng sau khi ho?t ??ng ho?c t?p th? d?c.  Cho ? l?nh vo ti nh?a.  ?? kh?n t?m vo gi?a da v ti.  ?? ? l?nh trong kho?ng 15-20 pht, 3-4 l?n m?i ngy, ho?c theo ch? d?n c?a chuyn gia ch?m [place] s?c kh?e c?a qu v?.  S? d?ng t?t c? thu?c v th?c ph?m ch?c n?ng theo h??ng d?n c?a chuyn gia ch?m  s?c  kh?e.  S? d?ng n?p theo ch? d?n c?a chuyn gia ch?m [place] s?c kh?e. N?p gip duy tr v? tr v ch?c n?ng c?a kh?p.  Khng ??t g?i d??i ??u g?i trong khi ng?. Lm nh? v?y c th? d?n ??n co th?t.  Tham gia vo m?t ch??ng trnh t? qu?n l ?? c?p nh?t cch ?i?u tr? v k? n?ng ??i ph m?i nh?t. NGAY L?P T?C ?I KHM N?U:  Qu v? b? ng?t x?u.  Qu v? c nh?ng lc c?c k? y?u.  Qu v? [employer] b? nng, ?au ? kh?p, v n?ng h?n ?au nh?c kh?p bnh th??ng.  Qu v? b? ?n l?nh.  Qu v? b? s?t. ??M B?O QU V?:  Hi?u r cc h??ng d?n ny.  S? theo di tnh tr?ng c?a mnh.  S? yu c?u tr? gip ngay l?p t?c n?u qu v? c?m th?y khng kh?e ho?c th?y tr?m tr?ng h?n. ?? BI?T THM THNG TIN  American College of Rheumatology (Tr??ng ?o t?o v? Th?p kh?p M?): www.rheumatology.org Arth[employer] (T? Ch?c Vim Kh?p): www.arthritis.org Document Released: 07/05/2005 Document Revised: 07/10/2013 [facility] Patient Information 2015 [place], [place]. This information is not intended to replace advice given to you by your health care provider. Make sure you discuss any questions you have with your health care provider.

## 2014-02-04 NOTE — Progress Notes (Signed)
[facility] [address], [place] [address] [phone]  Date:  02/04/2014   Name:  Isaly Fasching   DOB:  Feb 01, 1956   MRN:  [id number]  PCP:  Linn Clavin,CHELLE, PA-C    Chief Complaint: Follow-up   History of Present Illness:  Katryna Tschirhart is a 58 y.o. very pleasant female patient who presents with the following:  On prednisone 40 daily.  Still having problems walking distances and has not returned to work.  No peripheral edema.  No indigestion.  No nausea or vomiting.  Poor appetite.  Denies other complaint or health concern today..  Patient Active Problem List   Diagnosis Date Noted  . Thyrotoxicosis 11/29/2013  . Neck pain 11/10/2013  . Ankle pain 11/10/2013  . Hand pain 04/18/2012    Past Medical History  Diagnosis Date  . Arthritis     No past surgical history on file.  History  Substance Use Topics  . Smoking status: Never Smoker   . Smokeless tobacco: Never Used  . Alcohol Use: No    Family History  Problem Relation Age of Onset  . Asthma Daughter     No Known Allergies  Medication list has been reviewed and updated.  Current Outpatient Prescriptions on File Prior to Visit  Medication Sig Dispense Refill  . HYDROmorphone (DILAUDID) 4 MG tablet Take 1 tablet (4 mg total) by mouth every 4 (four) hours as needed for severe pain.  50 tablet  0   No current facility-administered medications on file prior to visit.    Review of Systems:  As per HPI, otherwise negative.    Physical Examination: Filed Vitals:   02/04/14 1800  BP: 158/80  Pulse: 77  Temp: 98.3 F (36.8 C)  Resp: 16   Filed Vitals:   02/04/14 1800  Height: 4\' 7"  (1.397 m)  Weight: 114 lb (51.71 kg)   Body mass index is 26.5 kg/(m^2). Ideal Body Weight: Weight in (lb) to have BMI = 25: 107.3   GEN: WDWN, NAD, Non-toxic, Alert & Oriented x 3  Marked improvement in appearance and level of activity.   HEENT: Atraumatic, Normocephalic.  Ears and Nose: No external  deformity. EXTR: No clubbing/cyanosis/edema NEURO: hesitant gait.  PSYCH: Normally interactive. Conversant. Not depressed or anxious appearing.  Calm demeanor.    Assessment and Plan: Polyarthritis Decrease prednisone dose Await rheumatology appt  Signed,  , MD

## 2014-02-12 ENCOUNTER — Ambulatory Visit (INDEPENDENT_AMBULATORY_CARE_PROVIDER_SITE_OTHER): Payer: BC Managed Care – PPO | Admitting: Emergency Medicine

## 2014-02-12 ENCOUNTER — Telehealth: Payer: Self-pay

## 2014-02-12 VITALS — BP 140/92 | HR 88 | Temp 98.3°F | Resp 16 | Ht <= 58 in | Wt 114.0 lb

## 2014-02-12 DIAGNOSIS — M069 Rheumatoid arthritis, unspecified: Secondary | ICD-10-CM

## 2014-02-12 MED ORDER — PREDNISONE 10 MG PO TABS: ORAL_TABLET | ORAL | Status: DC

## 2014-02-12 NOTE — Telephone Encounter (Signed)
Resolved.  Please see previous msg.

## 2014-02-12 NOTE — Telephone Encounter (Signed)
Interpreter called. Advised that he and patient will be coming in to see Dr. Dareen Piano today. No return call needed at this time.

## 2014-02-12 NOTE — Progress Notes (Signed)
[facility] [address], [place] [address] [phone]  Date:  02/12/2014   Name:  Mandy Williams   DOB:  Dec 15, 1955   MRN:  [id number]  PCP:  Fama Muenchow,CHELLE, PA-C    Chief Complaint: Pain   History of Present Illness:  Mandy Williams is a 58 y.o. very pleasant female patient who presents with the following:  Seen recently for arthritis.  Is awaiting an appt 8/38 with rheumatology.  Apparently ran out of prednisone Sunday  and is having severe pain in hands again.  Say she has not been to work due to the pain and cannot drive a car as she currently is due to pain.  Denies other complaint or health concern today. .  Patient Active Problem List   Diagnosis Date Noted  . Thyrotoxicosis 11/29/2013  . Neck pain 11/10/2013  . Ankle pain 11/10/2013  . Hand pain 04/18/2012    Past Medical History  Diagnosis Date  . Arthritis     History reviewed. No pertinent past surgical history.  History  Substance Use Topics  . Smoking status: Never Smoker   . Smokeless tobacco: Never Used  . Alcohol Use: No    Family History  Problem Relation Age of Onset  . Asthma Daughter     No Known Allergies  Medication list has been reviewed and updated.  Current Outpatient Prescriptions on File Prior to Visit  Medication Sig Dispense Refill  . HYDROmorphone (DILAUDID) 4 MG tablet Take 1 tablet (4 mg total) by mouth every 4 (four) hours as needed for severe pain.  50 tablet  0   No current facility-administered medications on file prior to visit.    Review of Systems:  As per HPI, otherwise negative.    Physical Examination: Filed Vitals:   02/12/14 1723  BP: 140/92  Pulse: 88  Temp: 98.3 F (36.8 C)  Resp: 16   Filed Vitals:   02/12/14 1723  Height: 4\' 7"  (1.397 m)  Weight: 114 lb (51.71 kg)   Body mass index is 26.5 kg/(m^2). Ideal Body Weight: Weight in (lb) to have BMI = 25: 107.3   GEN: WDWN, NAD, Non-toxic, Alert & Oriented x 3 HEENT: Atraumatic,  Normocephalic.  Ears and Nose: No external deformity. EXTR: No clubbing/cyanosis/edema NEURO: Normal gait.  PSYCH: Normally interactive. Conversant. Not depressed or anxious appearing.  Calm demeanor.  Hands:  No swelling or erythema or deformity.  Has tenderness in joints of fingers and guards.  Assessment and Plan: Arthritis Resume prednisone and await rheumatology.  Signed,  , MD

## 2014-02-22 ENCOUNTER — Telehealth: Payer: Self-pay | Admitting: Emergency Medicine

## 2014-02-22 NOTE — Telephone Encounter (Signed)
Patient dropped off FMLA ppw on 02/22/2014 and it was placed in Dr. Ewell Poe box on the same day. Awaiting completion in 5-7 business days. Patient has asked that we fax ppw to his employer.

## 2014-03-01 ENCOUNTER — Ambulatory Visit: Payer: BC Managed Care – PPO | Admitting: Internal Medicine

## 2014-03-01 DIAGNOSIS — Z0289 Encounter for other administrative examinations: Secondary | ICD-10-CM

## 2014-03-06 ENCOUNTER — Telehealth: Payer: Self-pay

## 2014-03-06 NOTE — Telephone Encounter (Signed)
Requesting refill on "Prednisone 10mg ".  Patient requesting it to be sent to [facility] on [address] and [address]. Patients call back number [phone]

## 2014-03-07 NOTE — Telephone Encounter (Signed)
Lm for pt to RTC 

## 2014-03-09 ENCOUNTER — Telehealth: Payer: Self-pay

## 2014-03-09 NOTE — Telephone Encounter (Signed)
PATIENT'S NEPHEW CALLED. PLEASE CALL BACK REGARDING PATIENT. PATIENT WANTS TO KNOW IF PRESCRIPTIONS CAN BE REFILLED. CB # [phone]

## 2014-03-10 ENCOUNTER — Other Ambulatory Visit: Payer: Self-pay | Admitting: Emergency Medicine

## 2014-03-11 NOTE — Telephone Encounter (Signed)
Pt has RA- uses prednisone to treat flare ups per last OV awaiting RA referral and it was not ordered. I have pended order.

## 2014-03-12 ENCOUNTER — Encounter: Payer: Self-pay | Admitting: Emergency Medicine

## 2014-03-12 NOTE — Telephone Encounter (Signed)
See note from pt below and phone message 03/09/14.

## 2014-03-14 NOTE — Telephone Encounter (Signed)
Notified pt's friend on phone as req'd in message that RF was denied. Encouraged pt to make the appt sch w/Dr Nickola Major today or RTC to be seen here if still having so much pain. Friend stated she will explain to pt.

## 2014-04-29 ENCOUNTER — Encounter: Payer: BC Managed Care – PPO | Admitting: Gynecology

## 2014-05-13 ENCOUNTER — Encounter: Payer: BC Managed Care – PPO | Admitting: Gynecology

## 2014-06-10 ENCOUNTER — Encounter: Payer: BC Managed Care – PPO | Admitting: Gynecology

## 2014-12-30 ENCOUNTER — Encounter: Payer: Self-pay | Admitting: *Deleted

## 2015-05-10 IMAGING — CR DG RIBS W/ CHEST 3+V*R*
4 series · 4 of 4 positions shown · non-contrast
Comparison: None.

CLINICAL DATA: Right rib pain

EXAM:
RIGHT RIBS AND CHEST - 3+ VIEW

[PA]
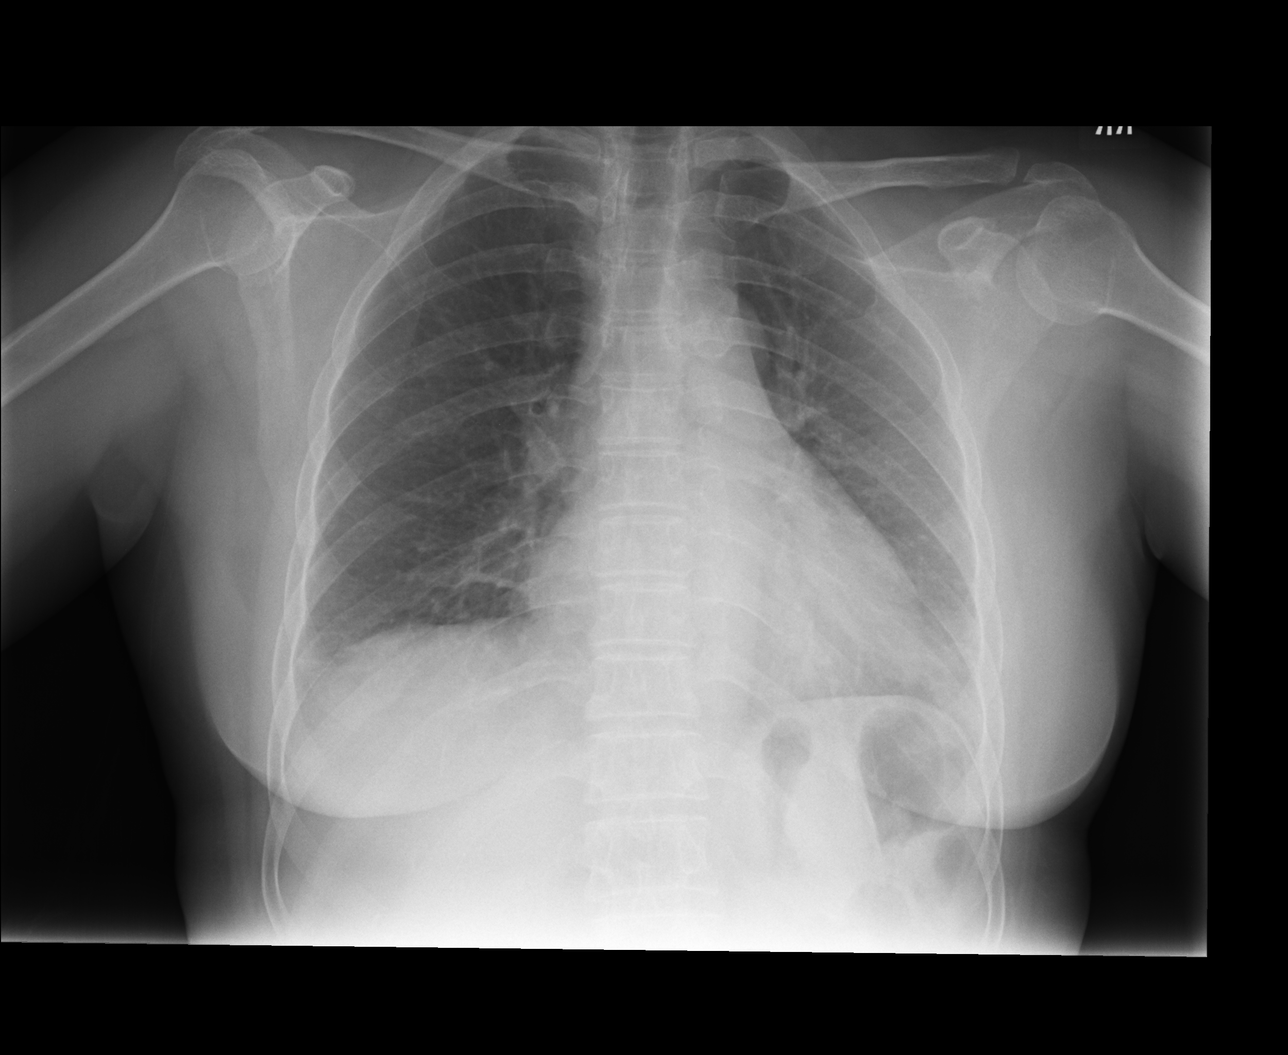

[rao]
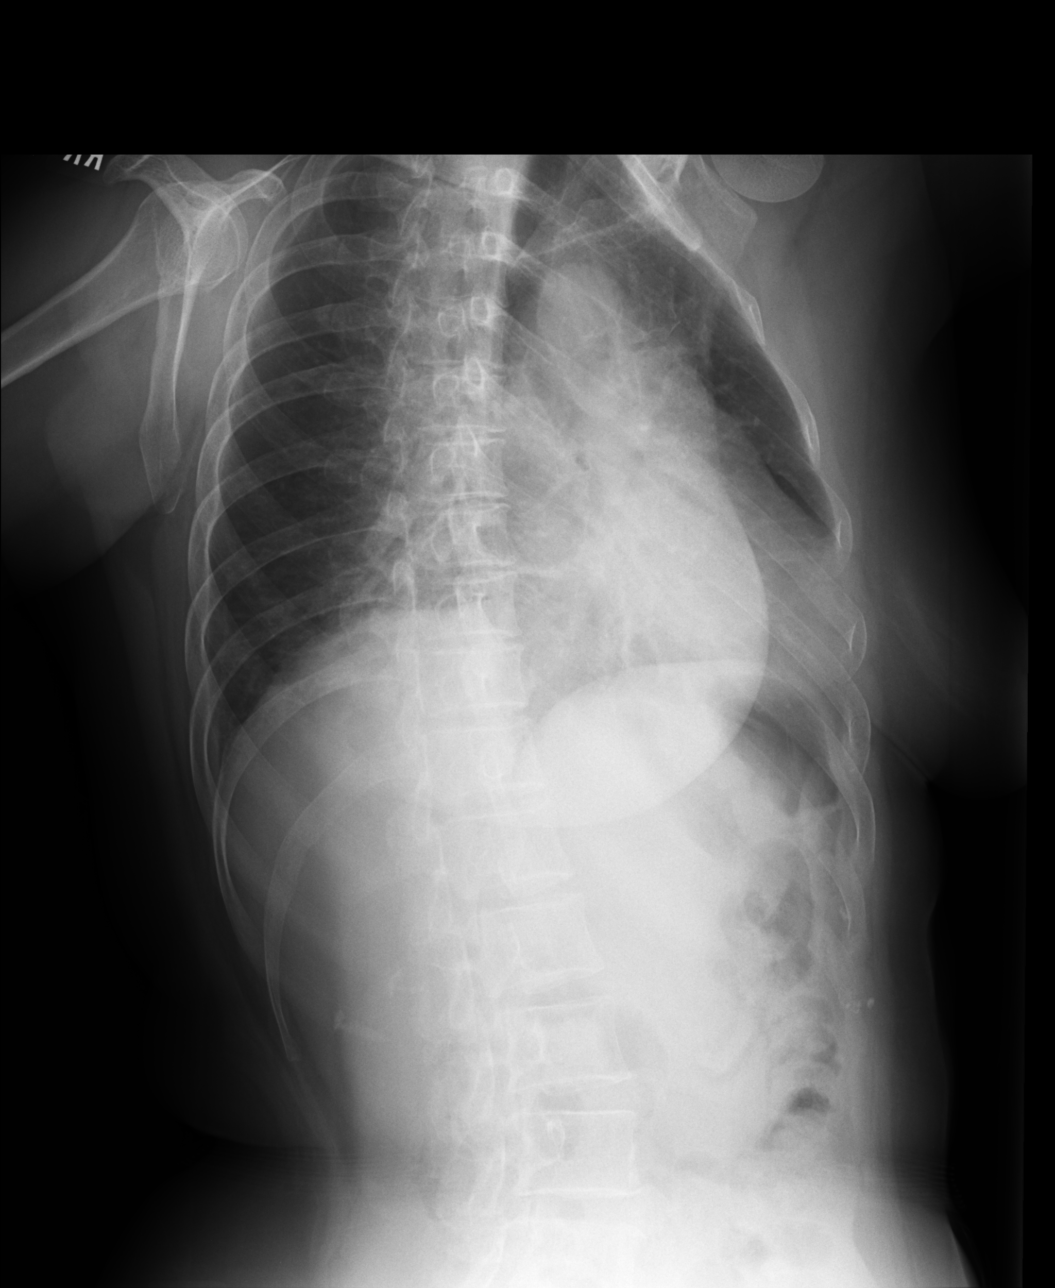

[lao]
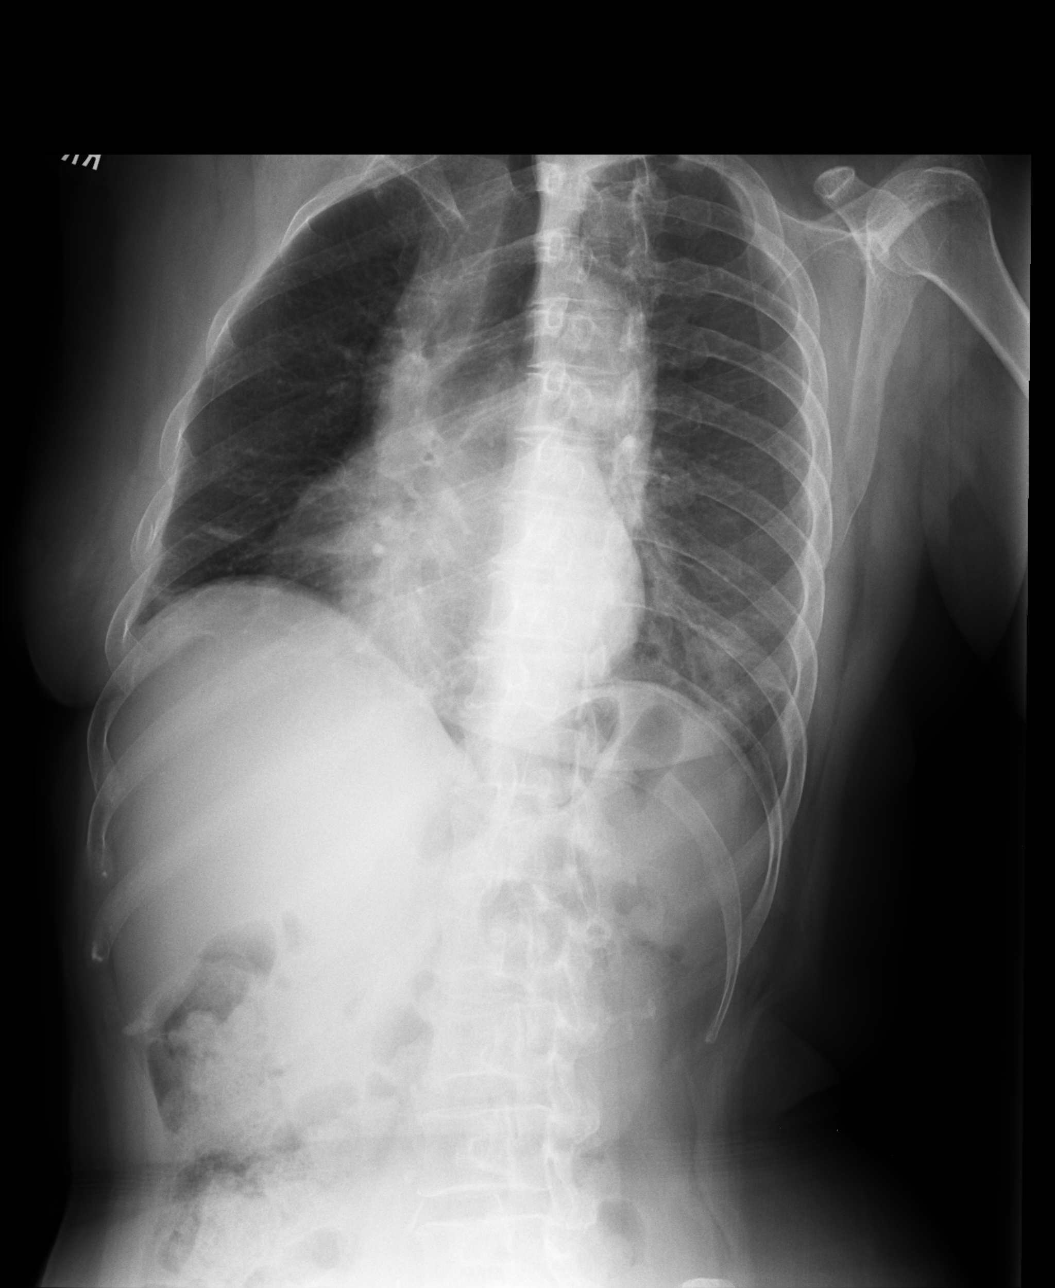

[lateral]
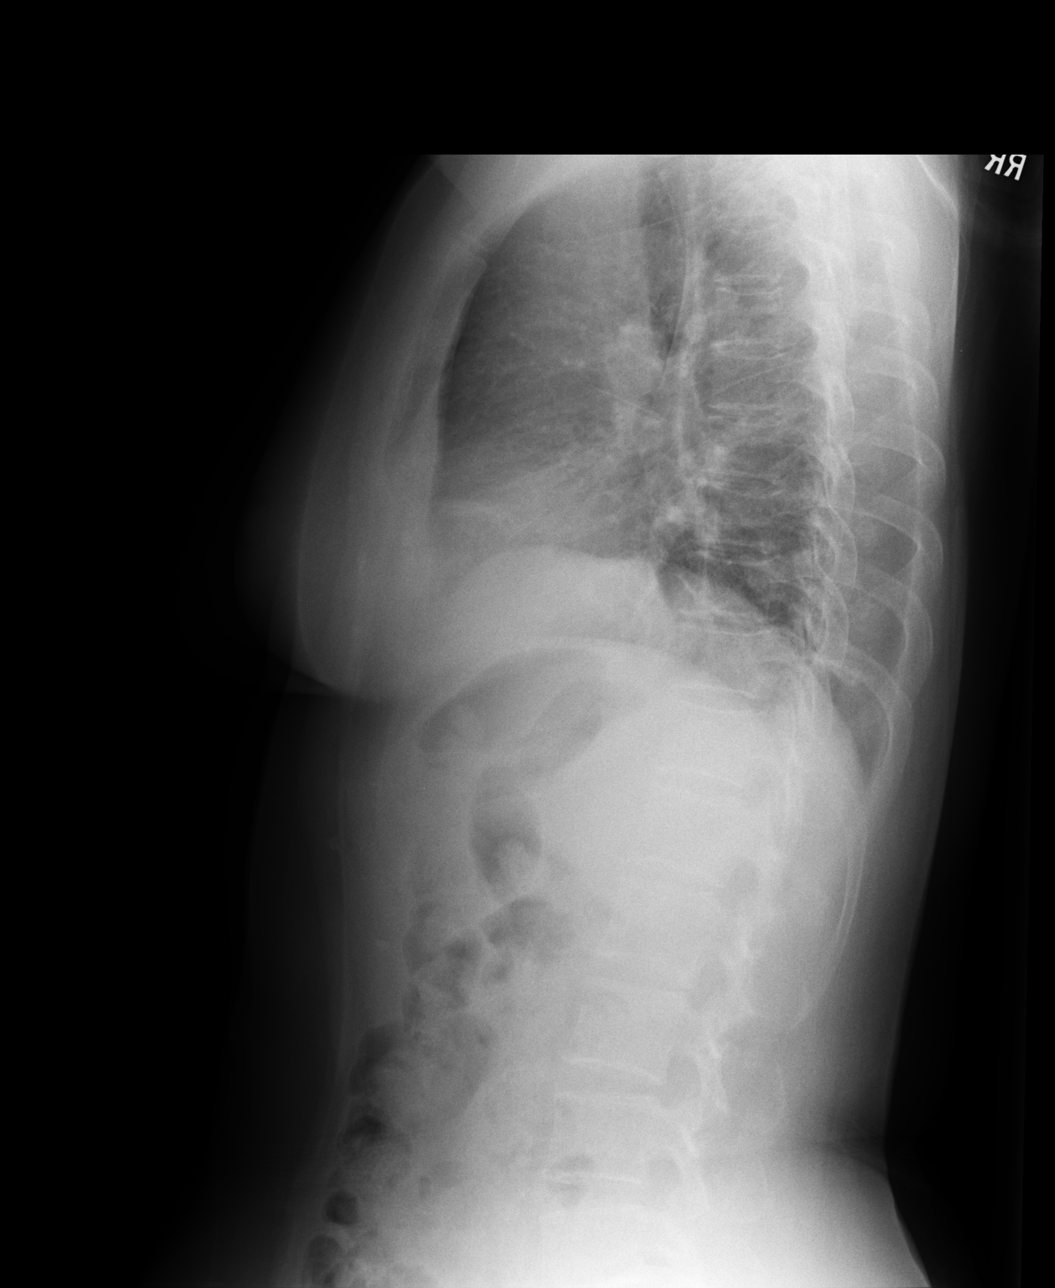

[4 of 4 positions shown; findings below may reference images not displayed]

FINDINGS: Cardiac shadow is within normal limits. The lungs are well aerated
bilaterally. Minimal right basilar atelectasis is seen. No acute rib
fractures are noted.
IMPRESSION: No acute rib fracture. Minimal right basilar atelectasis.

## 2015-05-14 ENCOUNTER — Ambulatory Visit (INDEPENDENT_AMBULATORY_CARE_PROVIDER_SITE_OTHER): Payer: Self-pay | Admitting: Family Medicine

## 2015-05-14 VITALS — HR 89 | Temp 98.7°F | Resp 20

## 2015-05-14 DIAGNOSIS — M05711 Rheumatoid arthritis with rheumatoid factor of right shoulder without organ or systems involvement: Secondary | ICD-10-CM

## 2015-05-14 DIAGNOSIS — M069 Rheumatoid arthritis, unspecified: Secondary | ICD-10-CM

## 2015-05-14 MED ORDER — METHYLPREDNISOLONE ACETATE 80 MG/ML IJ SUSP
80.0000 mg | Freq: Once | INTRAMUSCULAR | Status: AC
  Administered 2015-05-14: 80 mg via INTRAMUSCULAR

## 2015-05-14 NOTE — Patient Instructions (Signed)
Keep your appointment with Dr. Nickola Major tomorrow.  you are going to need a chronic rheumatoid arthritis medication to prevent joint destruction. That shot should help get things under control for the next week but will wear off after 5-7 days.

## 2015-05-14 NOTE — Progress Notes (Signed)
This is a 59 year old [place] woman, married, who is brought in by a friend because she's having an acute rheumatoid arthritis flare. She's been diagnosed over a year  Ago, and has seen her primary care doctor, Dr.Avbuere, who wrote for prednisone recently. After the prednisone wore off, the pain became quite severe and over the last 3 or 4 days patient has been immobilized.   patient has been treated by Dr. Nickola Major in the past , but was lost to follow-up and has not been receiving any chronic arthritis mdicine in quite some time.   the last 3 days, the patient is gone from no pain to immobilization. She notes swelling of all of her finger joints , her shoulder, her elbows, her knees, ankles.   Objective: Patient is obviously significant pain   HEENT: Unremarkable Extremities: Marked synovial swelling of all finger joints , wrists. Range of motion is extremely limited everywhere because of pain.  This chart was scribed in my presence and reviewed by me personally.    ICD-9-CM ICD-10-CM   1. Rheumatoid arthritis flare (HCC) 714.0 M06.9 methylPREDNISolone acetate (DEPO-MEDROL) injection 80 mg    Patient instructed to see her rheumatologist (appointment already made) tomorrow.  Signed, Elvina Sidle, MD

## 2015-11-03 ENCOUNTER — Other Ambulatory Visit: Payer: Self-pay | Admitting: Internal Medicine

## 2015-11-03 DIAGNOSIS — E2839 Other primary ovarian failure: Secondary | ICD-10-CM

## 2015-11-05 ENCOUNTER — Other Ambulatory Visit: Payer: Self-pay | Admitting: Internal Medicine

## 2015-11-05 DIAGNOSIS — Z1231 Encounter for screening mammogram for malignant neoplasm of breast: Secondary | ICD-10-CM

## 2015-11-14 ENCOUNTER — Other Ambulatory Visit: Payer: Self-pay

## 2015-11-24 ENCOUNTER — Ambulatory Visit
Admission: RE | Admit: 2015-11-24 | Discharge: 2015-11-24 | Disposition: A | Discharge status: Home / Self Care | Payer: Medicaid Other | Source: Ambulatory Visit | Attending: Internal Medicine | Admitting: Internal Medicine

## 2015-11-24 DIAGNOSIS — E2839 Other primary ovarian failure: Secondary | ICD-10-CM

## 2015-11-24 DIAGNOSIS — Z1231 Encounter for screening mammogram for malignant neoplasm of breast: Secondary | ICD-10-CM

## 2016-08-07 ENCOUNTER — Ambulatory Visit (INDEPENDENT_AMBULATORY_CARE_PROVIDER_SITE_OTHER): Payer: Medicaid Other

## 2016-08-07 ENCOUNTER — Encounter (HOSPITAL_COMMUNITY): Payer: Self-pay | Admitting: Family Medicine

## 2016-08-07 ENCOUNTER — Ambulatory Visit (HOSPITAL_COMMUNITY)
Admission: EM | Admit: 2016-08-07 | Discharge: 2016-08-07 | Disposition: A | Discharge status: Home / Self Care | Payer: Medicaid Other | Attending: Family Medicine | Admitting: Family Medicine

## 2016-08-07 DIAGNOSIS — J181 Lobar pneumonia, unspecified organism: Secondary | ICD-10-CM | POA: Diagnosis not present

## 2016-08-07 DIAGNOSIS — J189 Pneumonia, unspecified organism: Secondary | ICD-10-CM

## 2016-08-07 MED ORDER — LEVOFLOXACIN 500 MG PO TABS: 500.0000 mg | ORAL_TABLET | Freq: Every day | ORAL | 0 refills | Status: DC

## 2016-08-07 MED ORDER — HYDROCODONE-HOMATROPINE 5-1.5 MG/5ML PO SYRP: 5.0000 mL | ORAL_SOLUTION | Freq: Four times a day (QID) | ORAL | 0 refills | Status: DC | PRN

## 2016-08-07 NOTE — ED Provider Notes (Signed)
[facility]    CSN: [id number] Arrival date & time: 08/07/16  1200     History   Chief Complaint Chief Complaint  Patient presents with  . Cough    HPI Alazay Loreli Slot is a 61 y.o. female.   This 61 year old woman who comes in with cough and nasal congestion for the past 2 days. She had some running nose and sinus congestion last week but this seemed been clearing until Thursday 2 days ago.  Yesterday she became dizzy and did fall down at one point after getting up quickly to go to the bathroom. She said she hit her head at the time but has had no further problems with dizziness or headache since she fell.  She states that her daughter is being treated for a similar illness.  She describes her cough is productive of yellow sputum.  She went to her primary care providers but they were unable to work her in until sometime next week.      Past Medical History:  Diagnosis Date  . Arthritis     Patient Active Problem List   Diagnosis Date Noted  . Rheumatoid arthritis (HCC) 05/14/2015    Priority: High  . Thyrotoxicosis 11/29/2013    History reviewed. No pertinent surgical history.  OB History    Gravida Para Term Preterm AB Living   2 2 2     2    SAB TAB Ectopic Multiple Live Births                   Home Medications    Prior to Admission medications   Medication Sig Start Date End Date Taking? Authorizing Provider  calcium carbonate (OSCAL) 1500 (600 Ca) MG TABS tablet Take by mouth 2 (two) times daily with a meal.   Yes Historical Provider, MD  Etanercept (ENBREL Noel) Inject into the skin.   Yes Historical Provider, MD  folic acid (FOLVITE) 1 MG tablet Take 1 mg by mouth daily.   Yes Historical Provider, MD  meloxicam (MOBIC) 7.5 MG tablet Take 7.5 mg by mouth daily.   Yes Historical Provider, MD  methotrexate (RHEUMATREX) 2.5 MG tablet Take 7.5 mg by mouth 3 (three) times a week.   Yes Historical Provider, MD  HYDROcodone-homatropine (HYDROMET) 5-1.5  MG/5ML syrup Take 5 mLs by mouth every 6 (six) hours as needed for cough. 08/07/16   08/09/16, MD  levofloxacin (LEVAQUIN) 500 MG tablet Take 1 tablet (500 mg total) by mouth daily. 08/07/16   08/09/16, MD  predniSONE (DELTASONE) 10 MG tablet Take 4 a day for three days, 3 for three days then 2 daily 02/12/14   02/14/14, MD    Family History Family History  Problem Relation Age of Onset  . Asthma Daughter     Social History Social History  Substance Use Topics  . Smoking status: Never Smoker  . Smokeless tobacco: Never Used  . Alcohol use No     Allergies   Patient has no known allergies.   Review of Systems Review of Systems  Constitutional: Positive for fatigue. Negative for fever.  HENT: Positive for congestion and sinus pressure. Negative for sore throat.   Respiratory: Positive for cough, shortness of breath and wheezing.   Cardiovascular: Negative.   Genitourinary: Negative.   Musculoskeletal: Positive for arthralgias.  Neurological: Positive for dizziness.  Psychiatric/Behavioral: Negative.      Physical Exam Triage Vital Signs ED Triage Vitals  Enc Vitals Group  BP      Pulse      Resp      Temp      Temp src      SpO2      Weight      Height      Head Circumference      Peak Flow      Pain Score      Pain Loc      Pain Edu?      Excl. in GC?    No data found.   Updated Vital Signs BP 153/88 (BP Location: Right Arm)   Pulse 87   Temp 98.5 F (36.9 C) (Oral)   Resp 24   SpO2 95%   Physical Exam  Constitutional: She is oriented to person, place, and time. She appears well-developed and well-nourished.  HENT:  Head: Normocephalic.  Right Ear: External ear normal.  Left Ear: External ear normal.  Mouth/Throat: Oropharynx is clear and moist.  Eyes: Conjunctivae and EOM are normal.  Neck: Normal range of motion. Neck supple.  Cardiovascular: Normal rate, regular rhythm and normal heart sounds.   Pulmonary/Chest:  Effort normal. She has wheezes. She has rales.  Musculoskeletal: Normal range of motion.  Lymphadenopathy:    She has no cervical adenopathy.  Neurological: She is alert and oriented to person, place, and time.  Skin: Skin is warm and dry.  Psychiatric: She has a normal mood and affect.  Nursing note and vitals reviewed.    UC Treatments / Results  Labs (all labs ordered are listed, but only abnormal results are displayed) Labs Reviewed - No data to display  EKG  EKG Interpretation None       Radiology CXR:  Hazy middle lobe with streaky densities.  Procedures Procedures (including critical care time)  Medications Ordered in UC Medications - No data to display   Initial Impression / Assessment and Plan / UC Course  I have reviewed the triage vital signs and the nursing notes.  Pertinent labs & imaging results that were available during my care of the patient were reviewed by me and considered in my medical decision making (see chart for details).     Final Clinical Impressions(s) / UC Diagnoses   Final diagnoses:  Community acquired pneumonia of right middle lobe of lung (HCC)    New Prescriptions New Prescriptions   HYDROCODONE-HOMATROPINE (HYDROMET) 5-1.5 MG/5ML SYRUP    Take 5 mLs by mouth every 6 (six) hours as needed for cough.   LEVOFLOXACIN (LEVAQUIN) 500 MG TABLET    Take 1 tablet (500 mg total) by mouth daily.     Elvina Sidle, MD 08/07/16 1242

## 2016-08-07 NOTE — ED Triage Notes (Signed)
Onset Thursday of cough and congestion, coughing up yellow.  Facial pain and headache.  Did vomit once yesterday and fell once yesterday due to dizziness.  Patient has a family member that is similarly sick.  Patient has a friend with her today.  Patient has not seen a doctor for this .

## 2016-08-10 ENCOUNTER — Emergency Department (HOSPITAL_COMMUNITY): Payer: Medicaid Other

## 2016-08-10 ENCOUNTER — Encounter (HOSPITAL_COMMUNITY): Payer: Self-pay

## 2016-08-10 ENCOUNTER — Emergency Department (HOSPITAL_COMMUNITY)
Admission: EM | Admit: 2016-08-10 | Discharge: 2016-08-10 | Disposition: A | Discharge status: Home / Self Care | Payer: Medicaid Other | Attending: Emergency Medicine | Admitting: Emergency Medicine

## 2016-08-10 DIAGNOSIS — J111 Influenza due to unidentified influenza virus with other respiratory manifestations: Secondary | ICD-10-CM | POA: Diagnosis not present

## 2016-08-10 DIAGNOSIS — R69 Illness, unspecified: Secondary | ICD-10-CM

## 2016-08-10 DIAGNOSIS — R51 Headache: Secondary | ICD-10-CM | POA: Diagnosis present

## 2016-08-10 MED ORDER — GUAIFENESIN ER 1200 MG PO TB12: 1.0000 | ORAL_TABLET | Freq: Two times a day (BID) | ORAL | 0 refills | Status: DC

## 2016-08-10 MED ORDER — SODIUM CHLORIDE 0.9 % IV BOLUS (SEPSIS)
1000.0000 mL | Freq: Once | INTRAVENOUS | Status: AC
  Administered 2016-08-10: 1000 mL via INTRAVENOUS

## 2016-08-10 MED ORDER — KETOROLAC TROMETHAMINE 30 MG/ML IJ SOLN
30.0000 mg | Freq: Once | INTRAMUSCULAR | Status: AC
  Administered 2016-08-10: 30 mg via INTRAVENOUS
  Filled 2016-08-10: qty 1

## 2016-08-10 MED ORDER — IBUPROFEN 800 MG PO TABS: 800.0000 mg | ORAL_TABLET | Freq: Three times a day (TID) | ORAL | 0 refills | Status: DC | PRN

## 2016-08-10 NOTE — ED Notes (Signed)
Pt up to bathroom with assistance 

## 2016-08-10 NOTE — ED Notes (Signed)
Pt transported to XR.  

## 2016-08-10 NOTE — ED Triage Notes (Signed)
Patient diagnosed with pneumonia this past weekend. Developed headache on Saturday. Taking levaquin and cough medication as prescribed for pneumonia. Alert and oriented. Normal vitals, pupils equal, denies trauma

## 2016-08-10 NOTE — ED Notes (Signed)
Pt son at bedside to help with language barrier

## 2016-08-10 NOTE — Discharge Instructions (Signed)
Return here as needed.  Follow-up with your primary care Dr. increase her fluid intake and rest as much as possible °

## 2016-08-10 NOTE — ED Notes (Signed)
Pt ambulated to restroom with son

## 2016-08-10 NOTE — ED Notes (Signed)
EDP at bedside  

## 2016-08-10 NOTE — ED Notes (Signed)
No signature pad on hall WOW

## 2016-08-12 NOTE — ED Provider Notes (Signed)
[facility]-EMERGENCY DEPT Provider Note   CSN: [id number] Arrival date & time: 08/10/16  1325     History   Chief Complaint Chief Complaint  Patient presents with  . pneumonia/headache    HPI Mandy Williams is a 61 y.o. female.  HPI Patient presents to the emergency department with a four-day history of cough, nasal congestion, body aches with chills.  The patient was seen in urgent care on Saturday and diagnosed with possible pneumonia, placed on Levaquin and cough suppressant.  The patient continues to have body aches and not feeling well, so she came in for reevaluation.  Patient did not take any other medications other than the ones prescribed. The patient denies chest pain, shortness of breath, headache,blurred vision, neck pain, weakness, numbness, dizziness, anorexia, edema, abdominal pain, nausea, vomiting, diarrhea, rash, back pain, dysuria, hematemesis, bloody stool, near syncope, or syncope. Past Medical History:  Diagnosis Date  . Arthritis     Patient Active Problem List   Diagnosis Date Noted  . Rheumatoid arthritis (HCC) 05/14/2015  . Thyrotoxicosis 11/29/2013    History reviewed. No pertinent surgical history.  OB History    Gravida Para Term Preterm AB Living   2 2 2     2    SAB TAB Ectopic Multiple Live Births                   Home Medications    Prior to Admission medications   Medication Sig Start Date End Date Taking? Authorizing Provider  calcium carbonate (OSCAL) 1500 (600 Ca) MG TABS tablet Take by mouth 2 (two) times daily with a meal.    Historical Provider, MD  Etanercept (ENBREL [place]) Inject into the skin.    Historical Provider, MD  folic acid (FOLVITE) 1 MG tablet Take 1 mg by mouth daily.    Historical Provider, MD  Guaifenesin 1200 MG TB12 Take 1 tablet (1,200 mg total) by mouth 2 (two) times daily. 08/10/16   Calene Paradiso, PA-C  HYDROcodone-homatropine (HYDROMET) 5-1.5 MG/5ML syrup Take 5 mLs by mouth every 6 (six) hours as needed for cough.  08/07/16   08/09/16, MD  ibuprofen (ADVIL,MOTRIN) 800 MG tablet Take 1 tablet (800 mg total) by mouth every 8 (eight) hours as needed. 08/10/16   08/12/16, PA-C  levofloxacin (LEVAQUIN) 500 MG tablet Take 1 tablet (500 mg total) by mouth daily. 08/07/16   08/09/16, MD  meloxicam (MOBIC) 7.5 MG tablet Take 7.5 mg by mouth daily.    Historical Provider, MD  methotrexate (RHEUMATREX) 2.5 MG tablet Take 7.5 mg by mouth 3 (three) times a week.    Historical Provider, MD  predniSONE (DELTASONE) 10 MG tablet Take 4 a day for three days, 3 for three days then 2 daily 02/12/14   02/14/14, MD    Family History Family History  Problem Relation Age of Onset  . Asthma Daughter     Social History Social History  Substance Use Topics  . Smoking status: Never Smoker  . Smokeless tobacco: Never Used  . Alcohol use No     Allergies   Patient has no known allergies.   Review of Systems Review of Systems  All other systems negative except as documented in the HPI. All pertinent positives and negatives as reviewed in the HPI. Physical Exam Updated Vital Signs BP 152/72 (BP Location: Right Arm)   Pulse 70   Temp 97.9 F (36.6 C) (Oral)   Resp 20   SpO2 94%  Physical Exam  Constitutional: She is oriented to person, place, and time. She appears well-developed and well-nourished. No distress.  HENT:  Head: Normocephalic and atraumatic.  Mouth/Throat: Oropharynx is clear and moist.  Eyes: Pupils are equal, round, and reactive to light.  Neck: Normal range of motion. Neck supple.  Cardiovascular: Normal rate, regular rhythm and normal heart sounds.  Exam reveals no gallop and no friction rub.   No murmur heard. Pulmonary/Chest: Effort normal and breath sounds normal. No respiratory distress. She has no wheezes.  Abdominal: Soft. Bowel sounds are normal. She exhibits no distension. There is no tenderness.  Neurological: She is alert and oriented to person, place,  and time. She exhibits normal muscle tone. Coordination normal.  Skin: Skin is warm and dry. Capillary refill takes less than 2 seconds. No rash noted. No erythema.  Psychiatric: She has a normal mood and affect. Her behavior is normal.  Nursing note and vitals reviewed.    ED Treatments / Results  Labs (all labs ordered are listed, but only abnormal results are displayed) Labs Reviewed - No data to display  EKG  EKG Interpretation None       Radiology Dg Chest 2 View  Result Date: 08/10/2016 CLINICAL DATA:  Cough and possible pneumonia. EXAM: CHEST  2 VIEW COMPARISON:  08/07/2016 FINDINGS: Lungs are clear without focal airspace disease. Heart and mediastinum are within normal limits. There appears to be old anterior left rib fractures. Atherosclerotic calcifications at the aortic arch. No pleural effusions. IMPRESSION: No active cardiopulmonary disease. Old left rib fractures. Electronically Signed   By: Richarda Overlie M.D.   On: 08/10/2016 18:51    Procedures Procedures (including critical care time)  Medications Ordered in ED Medications  sodium chloride 0.9 % bolus 1,000 mL (0 mLs Intravenous Stopped 08/10/16 1947)  ketorolac (TORADOL) 30 MG/ML injection 30 mg (30 mg Intravenous Given 08/10/16 1937)     Initial Impression / Assessment and Plan / ED Course  I have reviewed the triage vital signs and the nursing notes.  Pertinent labs & imaging results that were available during my care of the patient were reviewed by me and considered in my medical decision making (see chart for details).     The patient's x-ray did not show any signs of pneumonia at this time, I feel that she has an influenza-like illness.  Told to rest and increase her fluid intake.  Patient is given IV fluids in the emergency department.  Told to return for any worsening in her condition I advised the patient to continue the Levaquin Final Clinical Impressions(s) / ED Diagnoses   Final diagnoses:    Influenza-like illness    New Prescriptions Discharge Medication List as of 08/10/2016  8:08 PM    START taking these medications   Details  Guaifenesin 1200 MG TB12 Take 1 tablet (1,200 mg total) by mouth 2 (two) times daily., Starting Tue 08/10/2016, Print    ibuprofen (ADVIL,MOTRIN) 800 MG tablet Take 1 tablet (800 mg total) by mouth every 8 (eight) hours as needed., Starting Tue 08/10/2016, Print         Charlestine Night, PA-C 08/12/16 0123    Charlynne Pander, MD 08/12/16 1511

## 2016-11-30 ENCOUNTER — Other Ambulatory Visit: Payer: Self-pay | Admitting: Internal Medicine

## 2016-11-30 DIAGNOSIS — Z1231 Encounter for screening mammogram for malignant neoplasm of breast: Secondary | ICD-10-CM

## 2017-01-24 ENCOUNTER — Ambulatory Visit
Admission: RE | Admit: 2017-01-24 | Discharge: 2017-01-24 | Disposition: A | Discharge status: Home / Self Care | Payer: Medicaid Other | Source: Ambulatory Visit | Attending: Internal Medicine | Admitting: Internal Medicine

## 2017-01-24 DIAGNOSIS — Z1231 Encounter for screening mammogram for malignant neoplasm of breast: Secondary | ICD-10-CM

## 2017-06-28 ENCOUNTER — Encounter: Payer: Self-pay | Admitting: Physician Assistant

## 2017-06-28 ENCOUNTER — Ambulatory Visit: Payer: Medicare Other | Admitting: Physician Assistant

## 2017-06-28 VITALS — BP 104/60 | HR 77 | Temp 98.3°F | Wt 111.6 lb

## 2017-06-28 DIAGNOSIS — J019 Acute sinusitis, unspecified: Secondary | ICD-10-CM

## 2017-06-28 DIAGNOSIS — R6889 Other general symptoms and signs: Secondary | ICD-10-CM

## 2017-06-28 LAB — POC INFLUENZA A&B (BINAX/QUICKVUE)
INFLUENZA A, POC: NEGATIVE
INFLUENZA B, POC: NEGATIVE

## 2017-06-28 MED ORDER — AMOXICILLIN-POT CLAVULANATE 875-125 MG PO TABS: 1.0000 | ORAL_TABLET | Freq: Two times a day (BID) | ORAL | 0 refills | Status: AC

## 2017-06-28 MED ORDER — HYDROCOD POLST-CPM POLST ER 10-8 MG/5ML PO SUER: 5.0000 mL | Freq: Two times a day (BID) | ORAL | 0 refills | Status: DC | PRN

## 2017-06-28 MED ORDER — AZELASTINE HCL 0.1 % NA SOLN: 2.0000 | Freq: Two times a day (BID) | NASAL | 0 refills | Status: DC

## 2017-06-28 NOTE — Patient Instructions (Signed)
     IF you received an x-ray today, you will receive an invoice from [facility]. Please contact [facility] at [phone] with questions or concerns regarding your invoice.   IF you received labwork today, you will receive an invoice from [facility]. Please contact [facility] at [phone] with questions or concerns regarding your invoice.   Our billing staff will not be able to assist you with questions regarding bills from these companies.  You will be contacted with the lab results as soon as they are available. The fastest way to get your results is to activate your My Chart account. Instructions are located on the last page of this paperwork. If you have not heard from us regarding the results in 2 weeks, please contact this office.     Sinusitis, Adult Sinusitis is soreness and inflammation of your sinuses. Sinuses are hollow spaces in the bones around your face. They are located:  Around your eyes.  In the middle of your forehead.  Behind your nose.  In your cheekbones.  Your sinuses and nasal passages are lined with a stringy fluid (mucus). Mucus normally drains out of your sinuses. When your nasal tissues get inflamed or swollen, the mucus can get trapped or blocked so air cannot flow through your sinuses. This lets bacteria, viruses, and funguses grow, and that leads to infection. Follow these instructions at home: Medicines  Take, use, or apply over-the-counter and prescription medicines only as told by your doctor. These may include nasal sprays.  If you were prescribed an antibiotic medicine, take it as told by your doctor. Do not stop taking the antibiotic even if you start to feel better. Hydrate and Humidify  Drink enough water to keep your pee (urine) clear or pale yellow.  Use a cool mist humidifier to keep the humidity level in your home above 50%.  Breathe in steam for 10-15 minutes, 3-4 times a day or as told by your doctor. You can do  this in the bathroom while a hot shower is running.  Try not to spend time in cool or dry air. Rest  Rest as much as possible.  Sleep with your head raised (elevated).  Make sure to get enough sleep each night. General instructions  Put a warm, moist washcloth on your face 3-4 times a day or as told by your doctor. This will help with discomfort.  Wash your hands often with soap and water. If there is no soap and water, use hand sanitizer.  Do not smoke. Avoid being around people who are smoking (secondhand smoke).  Keep all follow-up visits as told by your doctor. This is important. Contact a doctor if:  You have a fever.  Your symptoms get worse.  Your symptoms do not get better within 10 days. Get help right away if:  You have a very bad headache.  You cannot stop throwing up (vomiting).  You have pain or swelling around your face or eyes.  You have trouble seeing.  You feel confused.  Your neck is stiff.  You have trouble breathing. This information is not intended to replace advice given to you by your health care provider. Make sure you discuss any questions you have with your health care provider. Document Released: 12/22/2007 Document Revised: 02/29/2016 Document Reviewed: 04/30/2015 Elsevier Interactive Patient Education  2018 Elsevier Inc.  

## 2017-06-28 NOTE — Progress Notes (Signed)
Patient ID: Mandy Williams, female    DOB: 09-29-1955, 60 y.o.   MRN: [id number]  PCP: Porfirio Oar, PA-C  Chief Complaint  Patient presents with  . Illness    Started a week ago. Pt is c/o coughing, fatigue, fever, and sore throat. Unable to sleep at night due to coughing so much.     Subjective:   Presents for evaluation of cough x 1 week.  Associated with fatigue, sore throat and fever. Coughing so much that she is unable to sleep at night. No ear pain, HA, dizziness, nausea, vomiting. No new muscle or joint pain (she is managed by rheumatology for RA). Last seen here 04/2015 by Dr. Milus Glazier, who has retired.  Last seen by ME 10/2013.  Review of Systems As above.    Patient Active Problem List   Diagnosis Date Noted  . Rheumatoid arthritis (HCC) 05/14/2015  . Thyrotoxicosis 11/29/2013    Prior to Admission medications   Medication Sig Start Date End Date Taking? Authorizing Provider  calcium carbonate (OSCAL) 1500 (600 Ca) MG TABS tablet Take by mouth 2 (two) times daily with a meal.   Yes [provider]  Etanercept (ENBREL [place]) Inject into the skin.   Yes [provider]  folic acid (FOLVITE) 1 MG tablet Take 1 mg by mouth daily.   Yes [provider]  ibuprofen (ADVIL,MOTRIN) 800 MG tablet Take 1 tablet (800 mg total) by mouth every 8 (eight) hours as needed. 08/10/16  Yes Lawyer, Cristal Deer, PA-C  meloxicam (MOBIC) 7.5 MG tablet Take 7.5 mg by mouth daily.   Yes [provider]  AFLURIA QUADRIVALENT 0.5 ML injection ADM 0.5ML IM UTD 05/23/17   [provider]  alendronate (FOSAMAX) 70 MG tablet TK 1 T PO Q WEEK 06/22/17   [provider]  predniSONE (DELTASONE) 5 MG tablet TK 1 T PO PRN 04/20/17   [provider]       No Known Allergies     Objective:  Physical Exam  Constitutional: She is oriented to person, place, and time. She appears well-developed and well-nourished. No distress.  BP  104/60   Pulse 77   Temp 98.3 F (36.8 C) (Oral)   Wt 111 lb 9.6 oz (50.6 kg)   SpO2 99%   BMI 25.94 kg/m    HENT:  Head: Normocephalic and atraumatic.  Right Ear: Hearing, tympanic membrane, external ear and ear canal normal.  Left Ear: Hearing, tympanic membrane, external ear and ear canal normal.  Nose: Mucosal edema and rhinorrhea present.  No foreign bodies. Right sinus exhibits no maxillary sinus tenderness and no frontal sinus tenderness. Left sinus exhibits no maxillary sinus tenderness and no frontal sinus tenderness.  Mouth/Throat: Uvula is midline, oropharynx is clear and moist and mucous membranes are normal. No uvula swelling. No oropharyngeal exudate.  Eyes: Conjunctivae and EOM are normal. Pupils are equal, round, and reactive to light. Right eye exhibits no discharge. Left eye exhibits no discharge. No scleral icterus.  Neck: Trachea normal, normal range of motion and full passive range of motion without pain. Neck supple. No thyroid mass and no thyromegaly present.  Cardiovascular: Normal rate, regular rhythm and normal heart sounds.  Pulmonary/Chest: Effort normal and breath sounds normal.  Lymphadenopathy:       Head (right side): No submandibular, no tonsillar, no preauricular, no posterior auricular and no occipital adenopathy present.       Head (left side): No submandibular, no tonsillar, no preauricular and no  occipital adenopathy present.    She has no cervical adenopathy.       Right: No supraclavicular adenopathy present.       Left: No supraclavicular adenopathy present.  Neurological: She is alert and oriented to person, place, and time. She has normal strength. No cranial nerve deficit or sensory deficit.  Skin: Skin is warm, dry and intact. No rash noted.  Psychiatric: She has a normal mood and affect. Her speech is normal and behavior is normal.       Results for orders placed or performed in visit on 06/28/17  POC Influenza A&B(BINAX/QUICKVUE)    Result Value Ref Range   Influenza A, POC Negative Negative   Influenza B, POC Negative Negative       Assessment & Plan:   1. Flu-like symptoms - POC Influenza A&B(BINAX/QUICKVUE)  2. Acute non-recurrent sinusitis, unspecified location Supportive care.  Anticipatory guidance.  RTC if symptoms worsen/persist. - amoxicillin-clavulanate (AUGMENTIN) 875-125 MG tablet; Take 1 tablet by mouth 2 (two) times daily for 10 days.  Dispense: 20 tablet; Refill: 0 - azelastine (ASTELIN) 0.1 % nasal spray; Place 2 sprays into both nostrils 2 (two) times daily. Use in each nostril as directed  Dispense: 30 mL; Refill: 0 - chlorpheniramine-HYDROcodone (TUSSIONEX PENNKINETIC ER) 10-8 MG/5ML SUER; Take 5 mLs by mouth every 12 (twelve) hours as needed for cough.  Dispense: 100 mL; Refill: 0    Return if symptoms worsen or fail to improve.   Fernande Bras, PA-C [facility] at [facility]

## 2017-06-29 ENCOUNTER — Ambulatory Visit: Payer: Self-pay | Admitting: Physician Assistant

## 2017-07-08 ENCOUNTER — Other Ambulatory Visit: Payer: Self-pay | Admitting: Internal Medicine

## 2017-07-08 DIAGNOSIS — M810 Age-related osteoporosis without current pathological fracture: Secondary | ICD-10-CM

## 2017-07-27 ENCOUNTER — Other Ambulatory Visit: Payer: Self-pay

## 2017-07-27 ENCOUNTER — Ambulatory Visit (INDEPENDENT_AMBULATORY_CARE_PROVIDER_SITE_OTHER): Payer: Medicare Other

## 2017-07-27 ENCOUNTER — Encounter: Payer: Self-pay | Admitting: Physician Assistant

## 2017-07-27 ENCOUNTER — Ambulatory Visit: Payer: Medicare Other | Admitting: Physician Assistant

## 2017-07-27 VITALS — BP 112/68 | HR 72 | Temp 97.8°F | Resp 16 | Ht <= 58 in | Wt 111.4 lb

## 2017-07-27 DIAGNOSIS — D649 Anemia, unspecified: Secondary | ICD-10-CM | POA: Diagnosis not present

## 2017-07-27 DIAGNOSIS — R05 Cough: Secondary | ICD-10-CM | POA: Diagnosis not present

## 2017-07-27 DIAGNOSIS — R053 Chronic cough: Secondary | ICD-10-CM

## 2017-07-27 LAB — POCT CBC
GRANULOCYTE PERCENT: 45 % (ref 37–80)
HEMATOCRIT: 33.3 % — AB (ref 37.7–47.9)
HEMOGLOBIN: 10.4 g/dL — AB (ref 12.2–16.2)
Lymph, poc: 2.7 (ref 0.6–3.4)
MCH: 23.2 pg — AB (ref 27–31.2)
MCHC: 31.3 g/dL — AB (ref 31.8–35.4)
MCV: 74.2 fL — AB (ref 80–97)
MID (cbc): 0.7 (ref 0–0.9)
MPV: 8.1 fL (ref 0–99.8)
POC GRANULOCYTE: 2.8 (ref 2–6.9)
POC LYMPH PERCENT: 43.1 %L (ref 10–50)
POC MID %: 11.9 %M (ref 0–12)
Platelet Count, POC: 263 10*3/uL (ref 142–424)
RBC: 4.49 M/uL (ref 4.04–5.48)
RDW, POC: 15.8 %
WBC: 6.3 10*3/uL (ref 4.6–10.2)

## 2017-07-27 LAB — GLUCOSE, POCT (MANUAL RESULT ENTRY): POC Glucose: 95 mg/dl (ref 70–99)

## 2017-07-27 MED ORDER — FAMOTIDINE 20 MG PO TABS: 20.0000 mg | ORAL_TABLET | Freq: Every day | ORAL | 1 refills | Status: AC

## 2017-07-27 MED ORDER — BENZONATATE 200 MG PO CAPS: 200.0000 mg | ORAL_CAPSULE | Freq: Two times a day (BID) | ORAL | 0 refills | Status: DC | PRN

## 2017-07-27 NOTE — Patient Instructions (Addendum)
  I am starting you on famotidine nightly for cough as well as tessalon pearls. I would like you to follow up in 3 weeks for a re-check of your symptoms.   IF you received an x-ray today, you will receive an invoice from [facility]. Please contact [facility] at [phone] with questions or concerns regarding your invoice.   IF you received labwork today, you will receive an invoice from [place]. Please contact [facility] at [phone] with questions or concerns regarding your invoice.   Our billing staff will not be able to assist you with questions regarding bills from these companies.  You will be contacted with the lab results as soon as they are available. The fastest way to get your results is to activate your My Chart account. Instructions are located on the last page of this paperwork. If you have not heard from [place] regarding the results in 2 weeks, please contact this office.

## 2017-07-27 NOTE — Progress Notes (Signed)
07/31/2017 12:17 PM   DOB: [birth year] / MRN: [id number]  SUBJECTIVE:  Mandy Williams is a 62 y.o. female presenting for cough.  This has been present for one month.  Has been seen before and that medication helped some but she continues coughing. No fever. She has been having GERD but not dysphagia, odynophagia, bloody stools.  No weight loss and never smoker.   She has No Known Allergies.   She  has a past medical history of Arthritis.    She  reports that  has never smoked. she has never used smokeless tobacco. She reports that she does not drink alcohol or use drugs. She  reports that she does not engage in sexual activity. The patient  has no past surgical history on file.  Her family history includes Asthma in her daughter.  Review of Systems  Constitutional: Negative for chills, diaphoresis and fever.  Eyes: Negative.   Respiratory: Negative for cough, hemoptysis, sputum production, shortness of breath and wheezing.   Cardiovascular: Negative for chest pain, orthopnea and leg swelling.  Gastrointestinal: Negative for abdominal pain, blood in stool, constipation, diarrhea, heartburn, melena, nausea and vomiting.  Genitourinary: Negative for flank pain.  Skin: Negative for rash.  Neurological: Negative for dizziness, sensory change, speech change, focal weakness and headaches.    The problem list and medications were reviewed and updated by myself where necessary and exist elsewhere in the encounter.   OBJECTIVE:  BP 112/68 (BP Location: Left Arm, Patient Position: Sitting, Cuff Size: Normal)   Pulse 72   Temp 97.8 F (36.6 C) (Oral)   Resp 16   Ht 4\' 7"  (1.397 m)   Wt 111 lb 6.4 oz (50.5 kg)   SpO2 98%   BMI 25.89 kg/m   Physical Exam  Constitutional: She is active.  Non-toxic appearance.  Cardiovascular: Normal rate, regular rhythm, S1 normal, S2 normal, normal heart sounds and intact distal pulses. Exam reveals no gallop, no friction rub and no decreased pulses.  No  murmur heard. Pulmonary/Chest: Effort normal. No stridor. No tachypnea. No respiratory distress. She has no wheezes. She has no rales.  Abdominal: She exhibits no distension.  Musculoskeletal: She exhibits no edema.  Neurological: She is alert.  Skin: Skin is warm and dry. She is not diaphoretic. No pallor.    Wt Readings from Last 3 Encounters:  07/27/17 111 lb 6.4 oz (50.5 kg)  06/28/17 111 lb 9.6 oz (50.6 kg)  02/12/14 114 lb (51.7 kg)     Recent Results (from the past 2160 hour(s))  POC Influenza A&B(BINAX/QUICKVUE)     Status: None   Collection Time: 06/28/17  1:06 PM  Result Value Ref Range   Influenza A, POC Negative Negative   Influenza B, POC Negative Negative  POCT CBC     Status: Abnormal   Collection Time: 07/27/17  2:50 PM  Result Value Ref Range   WBC 6.3 4.6 - 10.2 K/uL   Lymph, poc 2.7 0.6 - 3.4   POC LYMPH PERCENT 43.1 10 - 50 %L   MID (cbc) 0.7 0 - 0.9   POC MID % 11.9 0 - 12 %M   POC Granulocyte 2.8 2 - 6.9   Granulocyte percent 45.0 37 - 80 %G   RBC 4.49 4.04 - 5.48 M/uL   Hemoglobin 10.4 (A) 12.2 - 16.2 g/dL   HCT, POC 09/24/17 (A) 16.9 - 47.9 %   MCV 74.2 (A) 80 - 97 fL   MCH, POC 23.2 (A) 27 -  31.2 pg   MCHC 31.3 (A) 31.8 - 35.4 g/dL   RDW, POC 04.8 %   Platelet Count, POC 263 142 - 424 K/uL   MPV 8.1 0 - 99.8 fL  POCT glucose (manual entry)     Status: None   Collection Time: 07/27/17  3:34 PM  Result Value Ref Range   POC Glucose 95 70 - 99 mg/dl  TSH     Status: None   Collection Time: 07/27/17  4:09 PM  Result Value Ref Range   TSH 1.050 0.450 - 4.500 uIU/mL  Iron, TIBC and Ferritin Panel     Status: Abnormal   Collection Time: 07/27/17  4:09 PM  Result Value Ref Range   Total Iron Binding Capacity 250 250 - 450 ug/dL   UIBC 889 169 - 450 ug/dL   Iron 388 27 - 828 ug/dL   Iron Saturation 41 15 - 55 %   Ferritin 482 (H) 15 - 150 ng/mL   CBC Latest Ref Rng & Units 07/27/2017 01/28/2014 11/10/2013  WBC 4.6 - 10.2 K/uL 6.3 14.9(H) 13.5(A)    Hemoglobin 12.2 - 16.2 g/dL 10.4(A) 11.2(L) 12.4  Hematocrit 37.7 - 47.9 % 33.3(A) 35.0(L) 39.5  Platelets 150 - 400 K/uL - 454(H) -   ASSESSMENT AND PLAN:  Lenia was seen today for cough.  Diagnoses and all orders for this visit:  Persistent cough for 3 weeks or longer: Chest rads normal.   -     DG Chest 2 View; Future -     EKG 12-Lead -     CBC with differential -     famotidine (PEPCID) 20 MG tablet; Take 1 tablet (20 mg total) by mouth at bedtime. -     POCT glucose (manual entry) -     benzonatate (TESSALON) 200 MG capsule; Take 1 capsule (200 mg total) by mouth 2 (two) times daily as needed for cough.  Anemia, unspecified type: Acutely worse. She had no abdominal pain on exam. I worry this may have something to do with her cough and her GERD. Will add on an iron panel and TSH.  Will bring her back in this week to check her progress with Pepcid and to recheck a CBC in the office.    The patient is advised to call or return to clinic if she does not see an improvement in symptoms, or to seek the care of the closest emergency department if she worsens with the above plan.   Deliah Boston, MHS, PA-C Primary Care at [facility] 07/31/2017 12:17 PM

## 2017-07-28 LAB — IRON,TIBC AND FERRITIN PANEL
Ferritin: 482 ng/mL — ABNORMAL HIGH (ref 15–150)
IRON SATURATION: 41 % (ref 15–55)
IRON: 102 ug/dL (ref 27–159)
Total Iron Binding Capacity: 250 ug/dL (ref 250–450)
UIBC: 148 ug/dL (ref 131–425)

## 2017-07-28 LAB — TSH: TSH: 1.05 u[IU]/mL (ref 0.450–4.500)

## 2017-08-01 ENCOUNTER — Ambulatory Visit: Payer: Medicare Other | Admitting: Physician Assistant

## 2017-08-01 ENCOUNTER — Encounter: Payer: Self-pay | Admitting: Physician Assistant

## 2017-08-01 ENCOUNTER — Other Ambulatory Visit: Payer: Self-pay

## 2017-08-01 VITALS — BP 116/74 | HR 76 | Resp 16 | Ht <= 58 in | Wt 110.2 lb

## 2017-08-01 DIAGNOSIS — D649 Anemia, unspecified: Secondary | ICD-10-CM

## 2017-08-01 DIAGNOSIS — R05 Cough: Secondary | ICD-10-CM | POA: Diagnosis not present

## 2017-08-01 DIAGNOSIS — R053 Chronic cough: Secondary | ICD-10-CM

## 2017-08-01 LAB — POCT CBC
Granulocyte percent: 43.2 %G (ref 37–80)
HEMATOCRIT: 32.1 % — AB (ref 37.7–47.9)
HEMOGLOBIN: 10.4 g/dL — AB (ref 12.2–16.2)
LYMPH, POC: 3.2 (ref 0.6–3.4)
MCH, POC: 23.6 pg — AB (ref 27–31.2)
MCHC: 32.4 g/dL (ref 31.8–35.4)
MCV: 72.8 fL — AB (ref 80–97)
MID (cbc): 0.8 (ref 0–0.9)
MPV: 7.3 fL (ref 0–99.8)
PLATELET COUNT, POC: 296 10*3/uL (ref 142–424)
POC GRANULOCYTE: 3 (ref 2–6.9)
POC LYMPH PERCENT: 45.8 %L (ref 10–50)
POC MID %: 11 %M (ref 0–12)
RBC: 4.41 M/uL (ref 4.04–5.48)
RDW, POC: 16.3 %
WBC: 6.9 10*3/uL (ref 4.6–10.2)

## 2017-08-01 MED ORDER — HYDROCODONE-HOMATROPINE 5-1.5 MG/5ML PO SYRP: 5.0000 mL | ORAL_SOLUTION | Freq: Three times a day (TID) | ORAL | 0 refills | Status: DC | PRN

## 2017-08-01 MED ORDER — BENZONATATE 200 MG PO CAPS: 200.0000 mg | ORAL_CAPSULE | Freq: Two times a day (BID) | ORAL | 0 refills | Status: DC | PRN

## 2017-08-01 NOTE — Patient Instructions (Addendum)
The test for fecal occult blood was negative in the office today.  This is reassuring from a bleeding standpoint.  I am not sure what is causing your cough but it is possible that it could be a lingering virus.  I have sent some more cough medicine to your pharmacy along with Tessalon.  It may not be a bad idea to try some Zyrtec or some Xyzal as well just in case there is an allergic component to your cough.

## 2017-08-01 NOTE — Progress Notes (Signed)
08/01/2017 3:45 PM   DOB: Mar 05, 1956 / MRN: [id number]  SUBJECTIVE:  Mandy Williams is a 62 y.o. female presenting for recheck of her cough and anemia.  Last time I saw her I started her on famotidine at night.  Her labs showed a mildly worse anemia with a largely normal iron count.  With regard to her cough she tells me  She has No Known Allergies.   She  has a past medical history of Arthritis.    She  reports that  has never smoked. she has never used smokeless tobacco. She reports that she does not drink alcohol or use drugs. She  reports that she does not engage in sexual activity. The patient  has no past surgical history on file.  Her family history includes Asthma in her daughter.  ROS  The problem list and medications were reviewed and updated by myself where necessary and exist elsewhere in the encounter.   OBJECTIVE:  BP 116/74 (BP Location: Right Arm, Patient Position: Sitting, Cuff Size: Normal)   Pulse 76   Resp 16   Ht 4\' 7"  (1.397 m)   Wt 110 lb 3.2 oz (50 kg)   SpO2 98%   BMI 25.61 kg/m   Wt Readings from Last 3 Encounters:  08/01/17 110 lb 3.2 oz (50 kg)  07/27/17 111 lb 6.4 oz (50.5 kg)  06/28/17 111 lb 9.6 oz (50.6 kg)   Temp Readings from Last 3 Encounters:  07/27/17 97.8 F (36.6 C) (Oral)  06/28/17 98.3 F (36.8 C) (Oral)  08/10/16 97.9 F (36.6 C) (Oral)   BP Readings from Last 3 Encounters:  08/01/17 116/74  07/27/17 112/68  06/28/17 104/60   Pulse Readings from Last 3 Encounters:  08/01/17 76  07/27/17 72  06/28/17 77   Physical Exam  Constitutional: She is active.  Non-toxic appearance.  Cardiovascular: Normal rate, regular rhythm, S1 normal, S2 normal, normal heart sounds and intact distal pulses. Exam reveals no gallop, no friction rub and no decreased pulses.  No murmur heard. Pulmonary/Chest: Effort normal. No stridor. No tachypnea. No respiratory distress. She has no wheezes. She has no rales.  Abdominal: She exhibits no distension.   Musculoskeletal: She exhibits no edema.  Neurological: She is alert.  Skin: Skin is warm and dry. She is not diaphoretic. No pallor.    Results for orders placed or performed in visit on 08/01/17 (from the past 72 hour(s))  POCT CBC     Status: Abnormal   Collection Time: 08/01/17  3:23 PM  Result Value Ref Range   WBC 6.9 4.6 - 10.2 K/uL   Lymph, poc 3.2 0.6 - 3.4   POC LYMPH PERCENT 45.8 10 - 50 %L   MID (cbc) 0.8 0 - 0.9   POC MID % 11.0 0 - 12 %M   POC Granulocyte 3.0 2 - 6.9   Granulocyte percent 43.2 37 - 80 %G   RBC 4.41 4.04 - 5.48 M/uL   Hemoglobin 10.4 (A) 12.2 - 16.2 g/dL   HCT, POC 08/03/17 (A) 36.6 - 47.9 %   MCV 72.8 (A) 80 - 97 fL   MCH, POC 23.6 (A) 27 - 31.2 pg   MCHC 32.4 31.8 - 35.4 g/dL   RDW, POC 29.4 %   Platelet Count, POC 296 142 - 424 K/uL   MPV 7.3 0 - 99.8 fL   CBC Latest Ref Rng & Units 08/01/2017 07/27/2017 01/28/2014  WBC 4.6 - 10.2 K/uL 6.9 6.3 14.9(H)  Hemoglobin 12.2 - 16.2 g/dL 10.4(A) 10.4(A) 11.2(L)  Hematocrit 37.7 - 47.9 % 32.1(A) 33.3(A) 35.0(L)  Platelets 150 - 400 K/uL - - 454(H)     No results found.  ASSESSMENT AND PLAN:  Sharline was seen today for follow-up.  Diagnoses and all orders for this visit:  Persistent cough for 3 weeks or longer Comments: Negative for chest pain shortness of breath. Advised that she continue Famotidine to cover for a GERD component.  Orders: -     HYDROcodone-homatropine (HYCODAN) 5-1.5 MG/5ML syrup; Take 5 mLs by mouth every 8 (eight) hours as needed for cough. -     benzonatate (TESSALON) 200 MG capsule; Take 1 capsule (200 mg total) by mouth 2 (two) times daily as needed for cough.  Anemia, unspecified type Comments: Stable no rectal bleeding. Orders: -     POCT CBC -     IFOBT POC (occult bld, rslt in office); Future    The patient is advised to call or return to clinic if she does not see an improvement in symptoms, or to seek the care of the closest emergency department if she worsens with  the above plan.   Deliah Boston, MHS, PA-C Primary Care at [facility] 08/01/2017 3:45 PM

## 2017-08-17 ENCOUNTER — Ambulatory Visit: Payer: Medicare Other | Admitting: Physician Assistant

## 2017-09-19 ENCOUNTER — Emergency Department (HOSPITAL_COMMUNITY)
Admission: EM | Admit: 2017-09-19 | Discharge: 2017-09-20 | Payer: Medicare Other | Attending: Emergency Medicine | Admitting: Emergency Medicine

## 2017-09-19 ENCOUNTER — Encounter (HOSPITAL_COMMUNITY): Payer: Self-pay

## 2017-09-19 DIAGNOSIS — T588X1A Toxic effect of carbon monoxide from other source, accidental (unintentional), initial encounter: Secondary | ICD-10-CM | POA: Diagnosis not present

## 2017-09-19 DIAGNOSIS — T5891XA Toxic effect of carbon monoxide from unspecified source, accidental (unintentional), initial encounter: Secondary | ICD-10-CM

## 2017-09-19 LAB — I-STAT ARTERIAL BLOOD GAS, ED
Acid-base deficit: 1 mmol/L (ref 0.0–2.0)
Bicarbonate: 22.5 mmol/L (ref 20.0–28.0)
O2 SAT: 100 %
PCO2 ART: 30.8 mmHg — AB (ref 32.0–48.0)
PO2 ART: 244 mmHg — AB (ref 83.0–108.0)
Patient temperature: 96.9
TCO2: 23 mmol/L (ref 22–32)
pH, Arterial: 7.467 — ABNORMAL HIGH (ref 7.350–7.450)

## 2017-09-19 LAB — I-STAT CG4 LACTIC ACID, ED: Lactic Acid, Venous: 3.17 mmol/L (ref 0.5–1.9)
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
## 2017-09-20 ENCOUNTER — Encounter: Payer: Self-pay | Admitting: Physician Assistant

## 2017-09-20 DIAGNOSIS — T588X1A Toxic effect of carbon monoxide from other source, accidental (unintentional), initial encounter: Secondary | ICD-10-CM | POA: Diagnosis present

## 2017-09-20 DIAGNOSIS — T5891XA Toxic effect of carbon monoxide from unspecified source, accidental (unintentional), initial encounter: Secondary | ICD-10-CM | POA: Insufficient documentation

## 2017-09-20 LAB — CBC WITH DIFFERENTIAL/PLATELET
BASOS PCT: 0 %
Basophils Absolute: 0 10*3/uL (ref 0.0–0.1)
Eosinophils Absolute: 0.2 10*3/uL (ref 0.0–0.7)
Eosinophils Relative: 3 %
HCT: 32.7 % — ABNORMAL LOW (ref 36.0–46.0)
HEMOGLOBIN: 10.5 g/dL — AB (ref 12.0–15.0)
LYMPHS ABS: 2.2 10*3/uL (ref 0.7–4.0)
LYMPHS PCT: 29 %
MCH: 24.3 pg — AB (ref 26.0–34.0)
MCHC: 32.1 g/dL (ref 30.0–36.0)
MCV: 75.7 fL — ABNORMAL LOW (ref 78.0–100.0)
Monocytes Absolute: 0.5 10*3/uL (ref 0.1–1.0)
Monocytes Relative: 7 %
NEUTROS ABS: 4.8 10*3/uL (ref 1.7–7.7)
Neutrophils Relative %: 61 %
Platelets: 241 10*3/uL (ref 150–400)
RBC: 4.32 MIL/uL (ref 3.87–5.11)
RDW: 14.5 % (ref 11.5–15.5)
WBC: 7.7 10*3/uL (ref 4.0–10.5)

## 2017-09-20 LAB — BASIC METABOLIC PANEL
ANION GAP: 13 (ref 5–15)
BUN: 16 mg/dL (ref 6–20)
CHLORIDE: 105 mmol/L (ref 101–111)
CO2: 19 mmol/L — AB (ref 22–32)
Calcium: 9.2 mg/dL (ref 8.9–10.3)
Creatinine, Ser: 0.89 mg/dL (ref 0.44–1.00)
GFR calc Af Amer: 44 mL/min — ABNORMAL LOW (ref >60)
GFR calc non Af Amer: 38 mL/min — ABNORMAL LOW (ref >60)
GLUCOSE: 129 mg/dL — AB (ref 65–99)
POTASSIUM: 4.3 mmol/L (ref 3.5–5.1)
Sodium: 137 mmol/L (ref 135–145)

## 2017-09-20 LAB — I-STAT TROPONIN, ED: TROPONIN I, POC: 0 ng/mL (ref 0.00–0.08)

## 2017-09-20 LAB — I-STAT CG4 LACTIC ACID, ED: LACTIC ACID, VENOUS: 2.27 mmol/L — AB (ref 0.5–1.9)

## 2017-09-20 LAB — COOXEMETRY PANEL
CARBOXYHEMOGLOBIN: 7 % — AB (ref 0.5–1.5)
Carboxyhemoglobin: 20.7 % (ref 0.5–1.5)
METHEMOGLOBIN: 1.6 % — AB (ref 0.0–1.5)
Methemoglobin: 1.9 % — ABNORMAL HIGH (ref 0.0–1.5)
O2 SAT: 84.4 %
O2 Saturation: 99.4 %
TOTAL HEMOGLOBIN: 10.3 g/dL — AB (ref 12.0–16.0)
Total hemoglobin: 11.4 g/dL — ABNORMAL LOW (ref 12.0–16.0)

## 2017-10-06 ENCOUNTER — Other Ambulatory Visit: Payer: Self-pay

## 2017-10-06 DIAGNOSIS — I6523 Occlusion and stenosis of bilateral carotid arteries: Secondary | ICD-10-CM

## 2017-10-14 ENCOUNTER — Ambulatory Visit (HOSPITAL_COMMUNITY)
Admission: RE | Admit: 2017-10-14 | Discharge: 2017-10-14 | Disposition: A | Discharge status: Home / Self Care | Payer: Medicare Other | Source: Ambulatory Visit | Attending: Vascular Surgery | Admitting: Vascular Surgery

## 2017-10-14 DIAGNOSIS — I6523 Occlusion and stenosis of bilateral carotid arteries: Secondary | ICD-10-CM | POA: Diagnosis not present

## 2017-10-17 ENCOUNTER — Other Ambulatory Visit: Payer: Self-pay | Admitting: Internal Medicine

## 2017-10-17 DIAGNOSIS — Z139 Encounter for screening, unspecified: Secondary | ICD-10-CM

## 2018-01-25 ENCOUNTER — Ambulatory Visit
Admission: RE | Admit: 2018-01-25 | Discharge: 2018-01-25 | Disposition: A | Discharge status: Home / Self Care | Payer: Medicare Other | Source: Ambulatory Visit | Attending: Internal Medicine | Admitting: Internal Medicine

## 2018-01-25 DIAGNOSIS — Z139 Encounter for screening, unspecified: Secondary | ICD-10-CM

## 2018-01-25 DIAGNOSIS — M810 Age-related osteoporosis without current pathological fracture: Secondary | ICD-10-CM

## 2018-02-13 ENCOUNTER — Other Ambulatory Visit: Payer: Self-pay | Admitting: Orthopedic Surgery

## 2018-02-13 DIAGNOSIS — M21371 Foot drop, right foot: Secondary | ICD-10-CM

## 2018-02-13 DIAGNOSIS — M5431 Sciatica, right side: Secondary | ICD-10-CM

## 2018-02-21 ENCOUNTER — Ambulatory Visit
Admission: RE | Admit: 2018-02-21 | Discharge: 2018-02-21 | Disposition: A | Discharge status: Home / Self Care | Payer: Medicare Other | Source: Ambulatory Visit | Attending: Orthopedic Surgery | Admitting: Orthopedic Surgery

## 2018-02-21 DIAGNOSIS — M5431 Sciatica, right side: Secondary | ICD-10-CM

## 2018-02-21 DIAGNOSIS — M21371 Foot drop, right foot: Secondary | ICD-10-CM

## 2018-03-16 ENCOUNTER — Other Ambulatory Visit: Payer: Medicare Other

## 2019-01-10 IMAGING — MG DIGITAL SCREENING BILATERAL MAMMOGRAM WITH CAD
4 series · 4 of 4 positions shown · non-contrast
Comparison: Previous exam(s).

CLINICAL DATA: Screening.

EXAM:
DIGITAL SCREENING BILATERAL MAMMOGRAM WITH CAD

[R MLO]
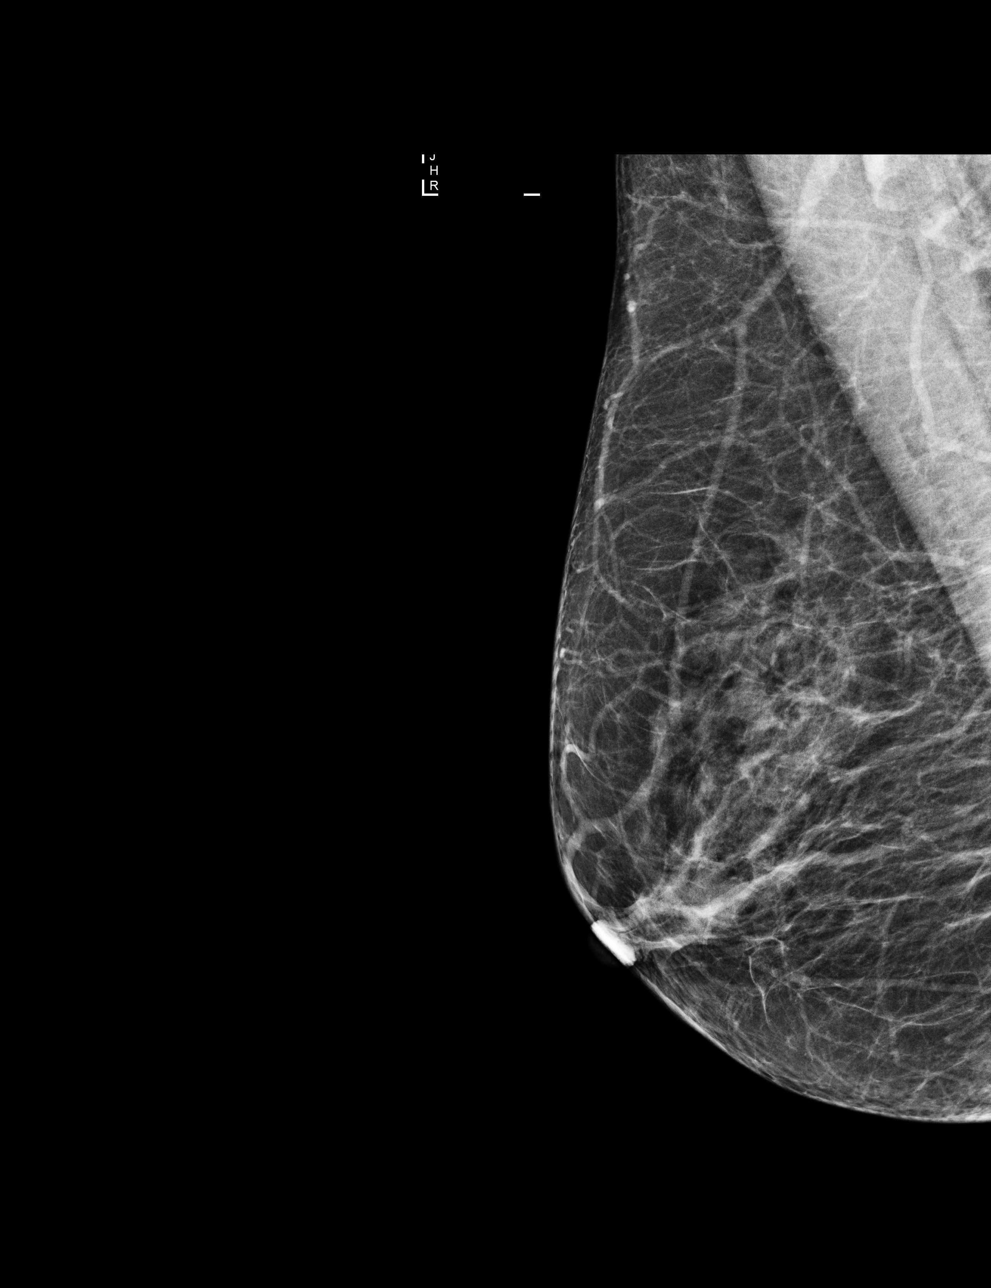

[R CC]
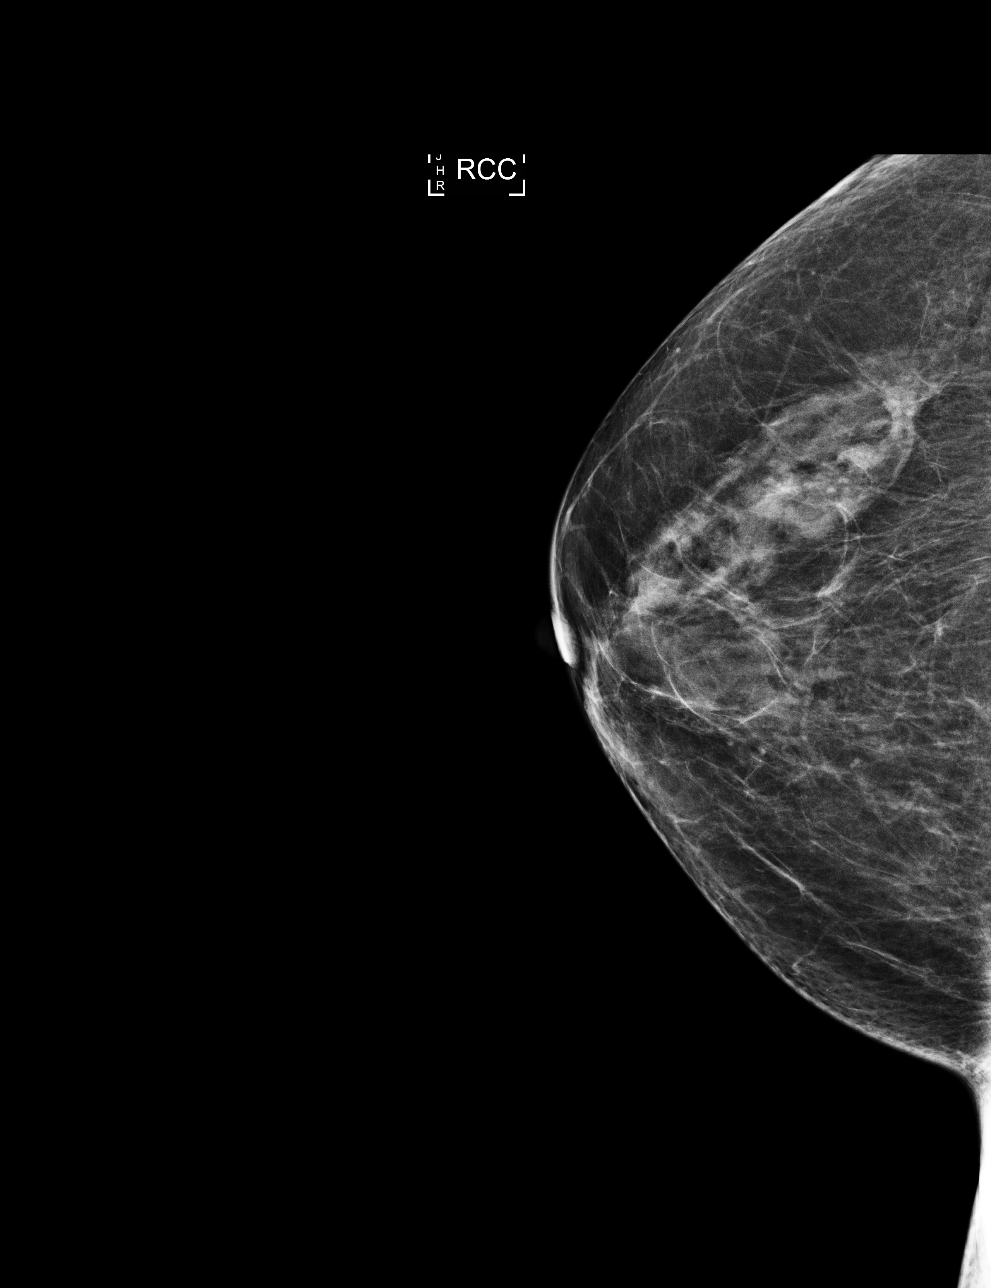

[L CC]
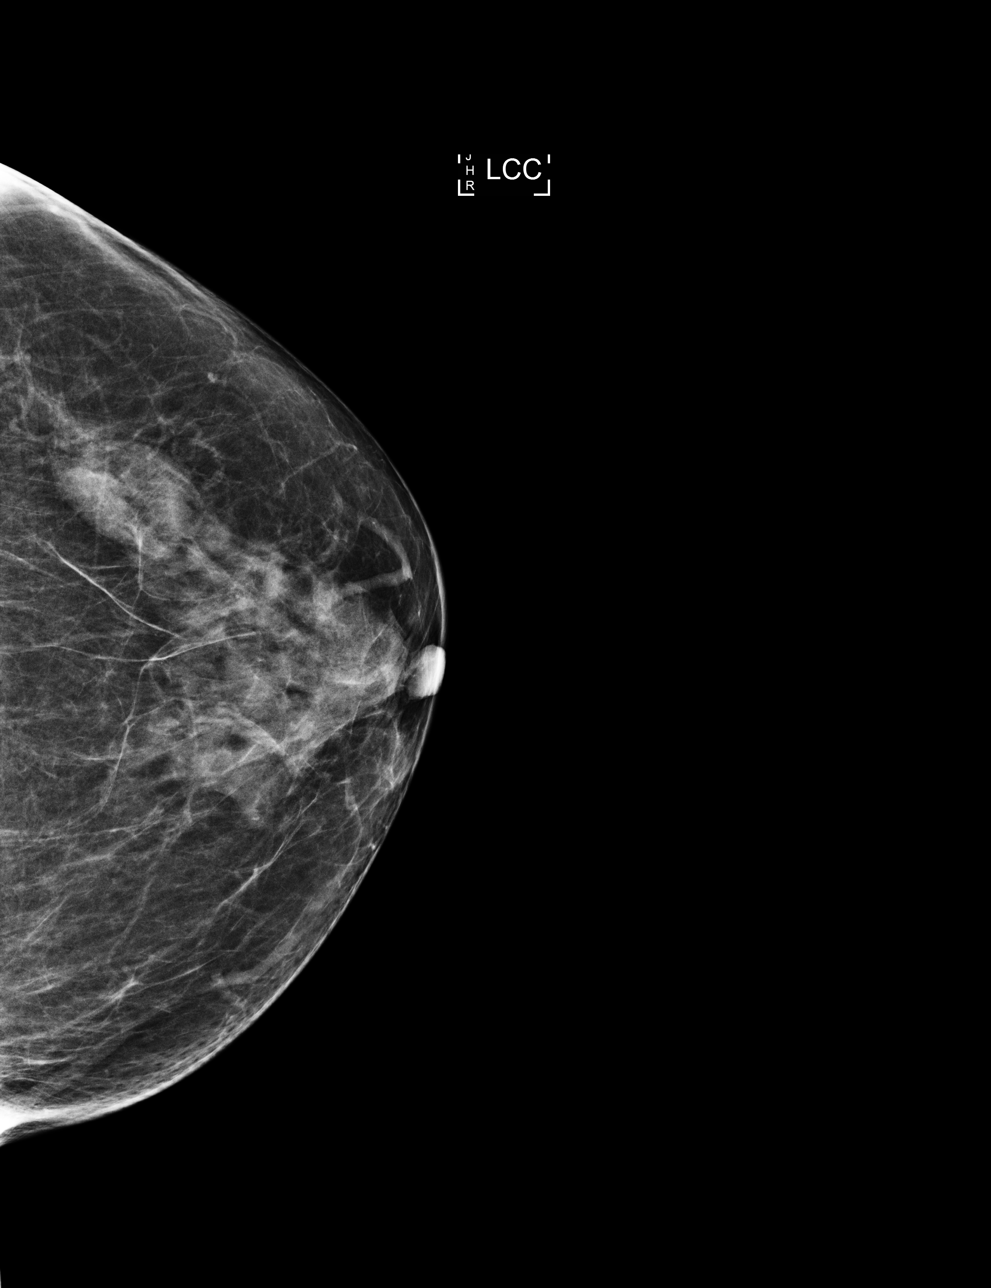

[L MLO]
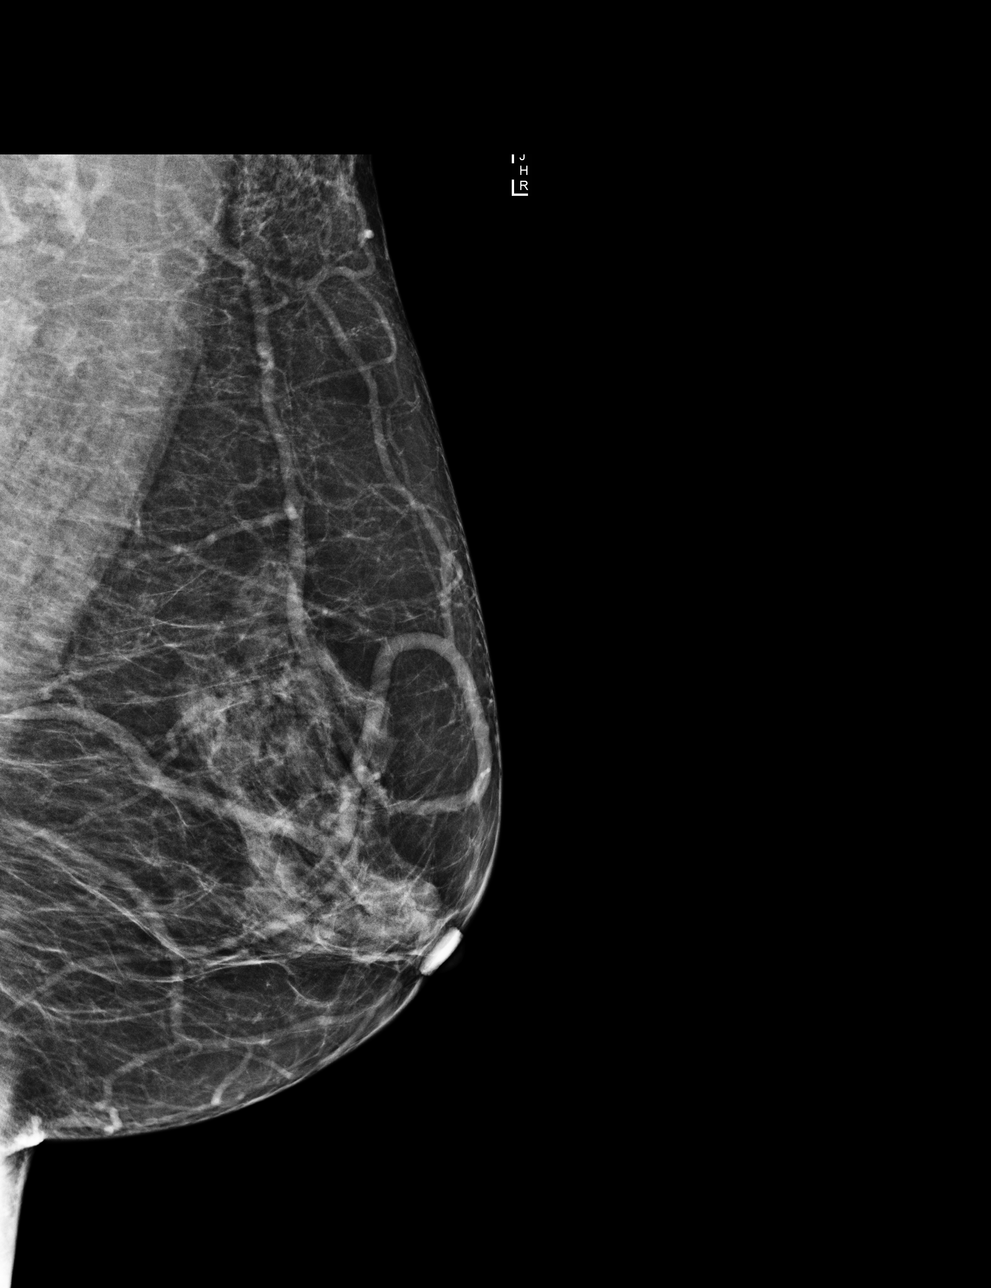

[4 of 4 positions shown; findings below may reference images not displayed]

ACR Breast Density Category c: The breast tissue is heterogeneously
dense, which may obscure small masses.
FINDINGS: There are no findings suspicious for malignancy. Images were
processed with CAD.
IMPRESSION: No mammographic evidence of malignancy. A result letter of this
screening mammogram will be mailed directly to the patient.

RECOMMENDATION:
Screening mammogram in one year. (Code:[id number])

BI-RADS CATEGORY  1: Negative.

## 2019-04-19 ENCOUNTER — Other Ambulatory Visit: Payer: Self-pay | Admitting: Internal Medicine

## 2019-04-19 DIAGNOSIS — M810 Age-related osteoporosis without current pathological fracture: Secondary | ICD-10-CM

## 2019-04-19 DIAGNOSIS — Z1231 Encounter for screening mammogram for malignant neoplasm of breast: Secondary | ICD-10-CM

## 2019-07-06 ENCOUNTER — Ambulatory Visit
Admission: RE | Admit: 2019-07-06 | Discharge: 2019-07-06 | Disposition: A | Discharge status: Home / Self Care | Payer: Medicare Other | Source: Ambulatory Visit | Attending: Internal Medicine | Admitting: Internal Medicine

## 2019-07-06 ENCOUNTER — Other Ambulatory Visit: Payer: Self-pay

## 2019-07-06 DIAGNOSIS — M810 Age-related osteoporosis without current pathological fracture: Secondary | ICD-10-CM

## 2019-07-06 DIAGNOSIS — Z1231 Encounter for screening mammogram for malignant neoplasm of breast: Secondary | ICD-10-CM

## 2019-10-11 ENCOUNTER — Ambulatory Visit: Payer: Medicare Other | Attending: Internal Medicine

## 2019-10-11 DIAGNOSIS — Z23 Encounter for immunization: Secondary | ICD-10-CM

## 2019-10-11 NOTE — Progress Notes (Signed)
   Covid-19 Vaccination Clinic  Name:  Mandy Williams    MRN: [id number] DOB: 1955-12-06  10/11/2019  [name] was observed post Covid-19 immunization for 15 minutes without incident. She was provided with Vaccine Information Sheet and instruction to access the V-Safe system.   Ms. Mandy Williams was instructed to call 911 with any severe reactions post vaccine: Marland Kitchen Difficulty breathing  . Swelling of face and throat  . A fast heartbeat  . A bad rash all over body  . Dizziness and weakness   Immunizations Administered    Name Date Dose VIS Date Route   Pfizer COVID-19 Vaccine 10/11/2019  2:17 PM 0.3 mL 06/29/2019 Intramuscular   Manufacturer: [employer], [employer]   Lot: [id number]   NDC: [address]-0

## 2019-11-07 ENCOUNTER — Ambulatory Visit: Payer: Medicare Other | Attending: Internal Medicine

## 2019-11-07 ENCOUNTER — Ambulatory Visit: Payer: Medicare Other

## 2019-11-07 DIAGNOSIS — Z23 Encounter for immunization: Secondary | ICD-10-CM

## 2019-11-07 NOTE — Progress Notes (Signed)
   Covid-19 Vaccination Clinic  Name:  Mandy Williams    MRN: [id number] DOB: August 26, 1955  11/07/2019  [name] was observed post Covid-19 immunization for 15 minutes without incident. She was provided with Vaccine Information Sheet and instruction to access the V-Safe system.   [name] was instructed to call 911 with any severe reactions post vaccine: Marland Kitchen Difficulty breathing  . Swelling of face and throat  . A fast heartbeat  . A bad rash all over body  . Dizziness and weakness   Immunizations Administered    Name Date Dose VIS Date Route   Pfizer COVID-19 Vaccine 11/07/2019  1:55 PM 0.3 mL 09/12/2018 Intramuscular   Manufacturer: [employer], [employer]   Lot: [id number]   NDC: [address]-3

## 2020-07-04 ENCOUNTER — Other Ambulatory Visit: Payer: Self-pay | Admitting: Internal Medicine

## 2020-07-04 DIAGNOSIS — Z1231 Encounter for screening mammogram for malignant neoplasm of breast: Secondary | ICD-10-CM

## 2020-08-13 ENCOUNTER — Other Ambulatory Visit: Payer: Self-pay

## 2020-08-13 ENCOUNTER — Ambulatory Visit
Admission: RE | Admit: 2020-08-13 | Discharge: 2020-08-13 | Disposition: A | Discharge status: Home / Self Care | Payer: Medicare Other | Source: Ambulatory Visit | Attending: Internal Medicine | Admitting: Internal Medicine

## 2020-08-13 DIAGNOSIS — Z1231 Encounter for screening mammogram for malignant neoplasm of breast: Secondary | ICD-10-CM

## 2020-12-31 ENCOUNTER — Other Ambulatory Visit: Payer: Self-pay | Admitting: Internal Medicine

## 2020-12-31 DIAGNOSIS — M810 Age-related osteoporosis without current pathological fracture: Secondary | ICD-10-CM

## 2021-01-04 ENCOUNTER — Ambulatory Visit (HOSPITAL_COMMUNITY)
Admission: EM | Admit: 2021-01-04 | Discharge: 2021-01-04 | Disposition: A | Discharge status: Home / Self Care | Payer: Medicare Other | Attending: Physician Assistant | Admitting: Physician Assistant

## 2021-01-04 ENCOUNTER — Ambulatory Visit (INDEPENDENT_AMBULATORY_CARE_PROVIDER_SITE_OTHER): Payer: Medicare Other

## 2021-01-04 ENCOUNTER — Encounter (HOSPITAL_COMMUNITY): Payer: Self-pay

## 2021-01-04 ENCOUNTER — Other Ambulatory Visit: Payer: Self-pay

## 2021-01-04 DIAGNOSIS — J329 Chronic sinusitis, unspecified: Secondary | ICD-10-CM | POA: Diagnosis not present

## 2021-01-04 DIAGNOSIS — R059 Cough, unspecified: Secondary | ICD-10-CM

## 2021-01-04 DIAGNOSIS — J40 Bronchitis, not specified as acute or chronic: Secondary | ICD-10-CM | POA: Diagnosis not present

## 2021-01-04 DIAGNOSIS — R030 Elevated blood-pressure reading, without diagnosis of hypertension: Secondary | ICD-10-CM

## 2021-01-04 DIAGNOSIS — J9801 Acute bronchospasm: Secondary | ICD-10-CM | POA: Diagnosis not present

## 2021-01-04 MED ORDER — PREDNISONE 20 MG PO TABS: 40.0000 mg | ORAL_TABLET | Freq: Every day | ORAL | 0 refills | Status: AC

## 2021-01-04 MED ORDER — ALBUTEROL SULFATE HFA 108 (90 BASE) MCG/ACT IN AERS
2.0000 | INHALATION_SPRAY | Freq: Once | RESPIRATORY_TRACT | Status: AC
  Administered 2021-01-04: 16:00:00 2 via RESPIRATORY_TRACT

## 2021-01-04 MED ORDER — AMOXICILLIN-POT CLAVULANATE 875-125 MG PO TABS: 1.0000 | ORAL_TABLET | Freq: Two times a day (BID) | ORAL | 0 refills | Status: DC

## 2021-01-04 MED ORDER — ALBUTEROL SULFATE HFA 108 (90 BASE) MCG/ACT IN AERS
INHALATION_SPRAY | RESPIRATORY_TRACT | Status: AC
  Filled 2021-01-04: qty 6.7

## 2021-01-04 NOTE — ED Triage Notes (Signed)
Pt in with c/o productive cough x 4 days  Pt states she has been taking tylenol for subjective fever

## 2021-01-04 NOTE — Discharge Instructions (Addendum)
Take prednisone burst to help with symptoms.  Return to normal 5 mg daily as needed after completing 5 days of 40 mg.  No ibuprofen, aspirin, Aleve while taking prednisone.  Take Augmentin twice a day to cover for infection.  Use albuterol as needed for shortness of breath and cough.  If anything worsens please return for reevaluation.  Blood pressure was elevated today.  Please monitor this at home and if it remains above 140/90 please return to clinic for reevaluation.  A primary care provider should contact you within a few weeks to schedule an appointment.  If you do not hear from them please return so we can recheck your blood pressure.  If you develop any chest pain, shortness of breath, headache, vision changes you need to go to the emergency room.  Avoid caffeine, decongestants, salt.

## 2021-01-04 NOTE — ED Provider Notes (Signed)
[facility]    CSN: [id number] Arrival date & time: 01/04/21  1339      History   Chief Complaint Chief Complaint  Patient presents with   Cough    HPI Mandy Williams is a 65 y.o. female.   Patient presents today accompanied by her son who help provide translation after declining video interpreter.  Patient reports a 5+ day history of cough.  She reports occasional shortness of breath and chest tightness but denies any chest pain, fever, congestion, sore throat, drainage.  She denies any known sick contacts.  She is up-to-date on COVID-19 vaccination but has not had booster.  She did receive a flu shot this year.  Denies any history of asthma, COPD, smoking.  She does have allergies but states current symptoms are not similar to previous episodes of this condition.  She denies any recent antibiotic use.  She is immunosuppressed due to history of rheumatoid arthritis that she is methotrexate and Enbrel.  She does have prednisone to be used as needed and has been using this recently without improvement of symptoms.   Past Medical History:  Diagnosis Date   Arthritis     Patient Active Problem List   Diagnosis Date Noted   Rheumatoid arthritis (HCC) 05/14/2015   Thyrotoxicosis 11/29/2013    History reviewed. No pertinent surgical history.  OB History     Gravida  2   Para  2   Term  2   Preterm  0   AB  0   Living         SAB  0   IAB  0   Ectopic  0   Multiple      Live Births               Home Medications    Prior to Admission medications   Medication Sig Start Date End Date Taking? Authorizing Provider  amoxicillin-clavulanate (AUGMENTIN) 875-125 MG tablet Take 1 tablet by mouth every 12 (twelve) hours. 01/04/21  Yes Meko Masterson K, PA-C  predniSONE (DELTASONE) 20 MG tablet Take 2 tablets (40 mg total) by mouth daily with breakfast for 4 days. 01/04/21 01/08/21 Yes Zoriah Pulice, Noberto Retort, PA-C  AFLURIA QUADRIVALENT 0.5 ML injection ADM 0.5ML IM UTD  05/23/17   [provider]  alendronate (FOSAMAX) 70 MG tablet TK 1 T PO Q WEEK 06/22/17   [provider]  azelastine (ASTELIN) 0.1 % nasal spray Place 2 sprays into both nostrils 2 (two) times daily. Use in each nostril as directed 06/28/17   Porfirio Oar, PA  benzonatate (TESSALON) 200 MG capsule Take 1 capsule (200 mg total) by mouth 2 (two) times daily as needed for cough. 08/01/17   Ofilia Neas, PA-C  calcium carbonate (OSCAL) 1500 (600 Ca) MG TABS tablet Take by mouth 2 (two) times daily with a meal.    [provider]  Etanercept (ENBREL [place]) Inject into the skin.    [provider]  famotidine (PEPCID) 20 MG tablet Take 1 tablet (20 mg total) by mouth at bedtime. 07/27/17   Ofilia Neas, PA-C  ferrous sulfate 325 (65 FE) MG tablet Take 325 mg by mouth daily with breakfast.    [provider]  folic acid (FOLVITE) 1 MG tablet Take 1 mg by mouth daily.    [provider]  HYDROcodone-homatropine (HYCODAN) 5-1.5 MG/5ML syrup Take 5 mLs by mouth every 8 (eight) hours as needed for cough. 08/01/17   Ofilia Neas,  PA-C  meloxicam (MOBIC) 7.5 MG tablet TK 1 T PO BID WF 07/21/17   [provider]  methotrexate (RHEUMATREX) 2.5 MG tablet TK 6 TS PO WEEKLY ON THE SAME DAY 07/15/17   [provider]  predniSONE (DELTASONE) 5 MG tablet TK 1 T PO PRN 04/20/17   [provider]    Family History Family History  Problem Relation Age of Onset   Asthma Daughter     Social History Social History   Tobacco Use   Smoking status: Never   Smokeless tobacco: Never  Substance Use Topics   Alcohol use: No   Drug use: No     Allergies   Patient has no known allergies.   Review of Systems Review of Systems  Constitutional:  Positive for activity change and fatigue. Negative for appetite change and fever.  HENT:  Negative for congestion, sinus pressure, sneezing and sore throat.   Respiratory:  Positive for  cough, chest tightness and shortness of breath. Negative for wheezing.   Cardiovascular:  Negative for chest pain.  Gastrointestinal:  Negative for abdominal pain, diarrhea, nausea and vomiting.  Musculoskeletal:  Negative for arthralgias and myalgias.  Neurological:  Negative for dizziness, light-headedness and headaches.    Physical Exam Triage Vital Signs ED Triage Vitals  Enc Vitals Group     BP 01/04/21 1532 (!) 162/94     Pulse Rate 01/04/21 1532 64     Resp 01/04/21 1532 17     Temp 01/04/21 1532 98.2 F (36.8 C)     Temp Source 01/04/21 1532 Oral     SpO2 01/04/21 1532 99 %     Weight --      Height --      Head Circumference --      Peak Flow --      Pain Score 01/04/21 1530 0     Pain Loc --      Pain Edu? --      Excl. in GC? --    No data found.  Updated Vital Signs BP (!) 162/94 (BP Location: Right Arm)   Pulse 64   Temp 98.2 F (36.8 C) (Oral)   Resp 17   SpO2 99%   Visual Acuity Right Eye Distance:   Left Eye Distance:   Bilateral Distance:    Right Eye Near:   Left Eye Near:    Bilateral Near:     Physical Exam Vitals reviewed.  Constitutional:      General: She is awake. She is not in acute distress.    Appearance: Normal appearance. She is normal weight. She is not ill-appearing.     Comments: Very pleasant female appears stated age in no acute distress  HENT:     Head: Normocephalic and atraumatic.     Right Ear: Tympanic membrane, ear canal and external ear normal. Tympanic membrane is not erythematous or bulging.     Left Ear: Tympanic membrane, ear canal and external ear normal. Tympanic membrane is not erythematous or bulging.     Nose:     Right Sinus: No maxillary sinus tenderness or frontal sinus tenderness.     Left Sinus: No maxillary sinus tenderness or frontal sinus tenderness.     Mouth/Throat:     Pharynx: Uvula midline. No oropharyngeal exudate or posterior oropharyngeal erythema.  Cardiovascular:     Rate and Rhythm:  Normal rate and regular rhythm.     Heart sounds: Normal heart sounds, S1 normal and S2 normal. No  murmur heard. Pulmonary:     Effort: Pulmonary effort is normal.     Breath sounds: Wheezing and rhonchi present. No rales.     Comments: Scattered wheezes and rhonchi throughout lung fields partially clear with cough. Lymphadenopathy:     Head:     Right side of head: No submental, submandibular or tonsillar adenopathy.     Left side of head: No submental, submandibular or tonsillar adenopathy.     Cervical: No cervical adenopathy.  Psychiatric:        Behavior: Behavior is cooperative.     UC Treatments / Results  Labs (all labs ordered are listed, but only abnormal results are displayed) Labs Reviewed - No data to display  EKG   Radiology DG Chest 2 View  Result Date: 01/04/2021 CLINICAL DATA:  Patient with productive cough for 4 days. EXAM: CHEST - 2 VIEW COMPARISON:  Chest radiograph 11/04/2017. FINDINGS: Stable cardiomegaly. Aortic atherosclerosis. No large area pulmonary consolidation. No pleural effusion or pneumothorax. Thoracic spine degenerative changes. IMPRESSION: No acute cardiopulmonary process. Electronically Signed   By: Annia Belt M.D.   On: 01/04/2021 16:45    Procedures Procedures (including critical care time)  Medications Ordered in UC Medications  albuterol (VENTOLIN HFA) 108 (90 Base) MCG/ACT inhaler 2 puff (2 puffs Inhalation Given 01/04/21 1627)    Initial Impression / Assessment and Plan / UC Course  I have reviewed the triage vital signs and the nursing notes.  Pertinent labs & imaging results that were available during my care of the patient were reviewed by me and considered in my medical decision making (see chart for details).      X-ray was normal.  No indication for influenza or COVID-19 testing given patient has been symptomatic for 5 days.  Given bronchospasm on exam she was given albuterol with improvement but not resolution of symptoms.   Will increase prednisone dose from 5 mg daily to 40 mg burst and then patient will return to previously prescribed maintenance dosing.  She was instructed not to take NSAIDs while taking prednisone due to risk of GI bleeding.  She was prescribed Augmentin.  Patient does not have a primary care provider so we will try to establish her with someone as she will likely need follow-up to consider maintenance medication such as Advair symptoms do not improve with current medication regimen.  Discussed alarm symptoms that warrant emergent evaluation.  Strict return precautions given to which patient expressed understanding.  Blood pressure is elevated today.  Patient denies history of essential hypertension and is not taking any antihypertensive medications.  She was instructed to avoid caffeine, decongestant, salt.  She denies any warm symptoms such as chest pain, shortness of breath, peripheral edema, headache, visual disturbance.  She was encouraged to monitor blood pressure at home and return if this is persistently above 140/90.  Discussed alarm symptoms that warrant emergent evaluation.  Strict return precautions given to which patient expressed understanding.  Final Clinical Impressions(s) / UC Diagnoses   Final diagnoses:  Sinobronchitis  Cough  Bronchospasm  Elevated blood pressure reading     Discharge Instructions      Take prednisone burst to help with symptoms.  Return to normal 5 mg daily as needed after completing 5 days of 40 mg.  No ibuprofen, aspirin, Aleve while taking prednisone.  Take Augmentin twice a day to cover for infection.  Use albuterol as needed for shortness of breath and cough.  If anything worsens please return for reevaluation.  Blood pressure was elevated today.  Please monitor this at home and if it remains above 140/90 please return to clinic for reevaluation.  A primary care provider should contact you within a few weeks to schedule an appointment.  If you do not  hear from them please return so we can recheck your blood pressure.  If you develop any chest pain, shortness of breath, headache, vision changes you need to go to the emergency room.  Avoid caffeine, decongestants, salt.     ED Prescriptions     Medication Sig Dispense Auth. Provider   predniSONE (DELTASONE) 20 MG tablet Take 2 tablets (40 mg total) by mouth daily with breakfast for 4 days. 8 tablet Milayah Krell K, PA-C   amoxicillin-clavulanate (AUGMENTIN) 875-125 MG tablet Take 1 tablet by mouth every 12 (twelve) hours. 14 tablet Kobie Whidby, Noberto Retort, PA-C      PDMP not reviewed this encounter.   Jeani Hawking, PA-C 01/04/21 1702

## 2021-01-15 ENCOUNTER — Other Ambulatory Visit: Payer: Self-pay | Admitting: Internal Medicine

## 2021-01-15 DIAGNOSIS — Z1231 Encounter for screening mammogram for malignant neoplasm of breast: Secondary | ICD-10-CM

## 2021-08-14 ENCOUNTER — Ambulatory Visit: Payer: Medicare Other

## 2021-08-14 ENCOUNTER — Other Ambulatory Visit: Payer: Medicare Other

## 2021-09-04 ENCOUNTER — Other Ambulatory Visit: Payer: Self-pay | Admitting: Internal Medicine

## 2021-09-04 DIAGNOSIS — M810 Age-related osteoporosis without current pathological fracture: Secondary | ICD-10-CM

## 2021-09-04 DIAGNOSIS — Z1231 Encounter for screening mammogram for malignant neoplasm of breast: Secondary | ICD-10-CM

## 2021-09-08 ENCOUNTER — Telehealth: Payer: Self-pay | Admitting: Family Medicine

## 2021-09-08 NOTE — Telephone Encounter (Signed)
With the help of an interpreter provided to me by the Dispatcher for Interpreter Services, the patient was called at the mobile number listed and the home number listed to inform her of the appointment needing to be rescheduled due to the interpreter not being available and the language not being available on the language line. Both numbers were unavailable and the patient was unable to be reached.

## 2021-09-09 ENCOUNTER — Encounter: Payer: Self-pay | Admitting: Certified Nurse Midwife

## 2021-09-09 ENCOUNTER — Other Ambulatory Visit (HOSPITAL_COMMUNITY)
Admission: RE | Admit: 2021-09-09 | Discharge: 2021-09-09 | Disposition: A | Discharge status: Home / Self Care | Payer: Medicare Other | Source: Ambulatory Visit | Attending: Certified Nurse Midwife | Admitting: Certified Nurse Midwife

## 2021-09-09 ENCOUNTER — Other Ambulatory Visit: Payer: Self-pay

## 2021-09-09 ENCOUNTER — Ambulatory Visit: Payer: Medicare Other | Admitting: Certified Nurse Midwife

## 2021-09-09 VITALS — BP 165/84 | HR 75 | Wt 106.0 lb

## 2021-09-09 DIAGNOSIS — Z1151 Encounter for screening for human papillomavirus (HPV): Secondary | ICD-10-CM | POA: Insufficient documentation

## 2021-09-09 DIAGNOSIS — Z124 Encounter for screening for malignant neoplasm of cervix: Secondary | ICD-10-CM | POA: Diagnosis not present

## 2021-09-09 DIAGNOSIS — Z01419 Encounter for gynecological examination (general) (routine) without abnormal findings: Secondary | ICD-10-CM

## 2021-09-10 NOTE — Progress Notes (Signed)
° °  ANNUAL EXAM Mandy Williams name: Mandy Williams MRN [id number]  Date of birth: March 03, 1956 Chief Complaint:   Gynecologic Exam  History of Present Illness:   Mandy Williams is a 66 y.o. G2P2000  Asian [place])  female being seen today for a routine annual exam.  Current complaints: None, just wants last Pap screening.  No LMP recorded. Mandy Williams is postmenopausal.  Last pap 10-47yrs ago. Results were:  normal . H/O abnormal pap: no Last mammogram: 2022. Results were: normal.   Depression screen [facility] 2/9 08/01/2017 07/27/2017 05/14/2015  Decreased Interest 0 0 0  Down, Depressed, Hopeless 0 0 0  PHQ - 2 Score 0 0 0   Review of Systems:   Pertinent items are noted in HPI Denies any headaches, blurred vision, fatigue, shortness of breath, chest pain, abdominal pain, abnormal vaginal discharge/itching/odor/irritation, problems with periods, bowel movements, urination, or intercourse unless otherwise stated above. Pertinent History Reviewed:  Reviewed past medical,surgical, social and family history.  Reviewed problem list, medications and allergies. Physical Assessment:   Vitals:   09/09/21 1429 09/09/21 1450  BP: (!) 191/169 (!) 165/84  Pulse: 77 75  Weight: 106 lb (48.1 kg)    Body mass index is 24.64 kg/m.        Physical Examination:   General appearance - well appearing, and in no distress  Mental status - alert, oriented to person, place, and time  Psych:  She has a normal mood and affect  Skin - warm and dry, normal color, no suspicious lesions noted  Chest - effort normal  Heart - normal rate and regular rhythm  Neck:  midline trachea, no thyromegaly or nodules  Breasts - exam deferred, has mammogram scheduled  Abdomen - soft, nontender, nondistended, no masses or organomegaly  Pelvic - VULVA: normal appearing vulva with no masses, tenderness or lesions  VAGINA: normal appearing vagina with normal color and discharge, no lesions  CERVIX: normal appearing cervix without discharge or  lesions, no CMT  Thin prep pap is done without HR HPV cotesting  UTERUS: uterus is felt to be normal size, shape, consistency and nontender   ADNEXA: No adnexal masses or tenderness noted.  Extremities:  No swelling or varicosities noted  Chaperone present for exam, as well as interpreter Jamelle Haring) present via phone.  No results found for this or any previous visit (from the past 24 hour(s)).  Assessment & Plan:      1. Screening for cervical cancer - Cytology - PAP( [facility])  Will follow up results of pap smear and manage accordingly. Mammogram scheduled Routine preventative health maintenance measures emphasized. Please refer to After Visit Summary for other counseling recommendations.       Mammogram:  already scheduled , or sooner if problems  No orders of the defined types were placed in this encounter.   Meds: No orders of the defined types were placed in this encounter.   Follow-up: Return for gynecological problems.  Edd Arbour, CNM, MSN, IBCLC Certified Nurse Midwife, [facility]

## 2021-09-11 LAB — CYTOLOGY - PAP
Chlamydia: NEGATIVE
Comment: NEGATIVE
Comment: NEGATIVE
Comment: NORMAL
Diagnosis: NEGATIVE
High risk HPV: NEGATIVE
Neisseria Gonorrhea: NEGATIVE

## 2021-09-22 ENCOUNTER — Ambulatory Visit
Admission: RE | Admit: 2021-09-22 | Discharge: 2021-09-22 | Disposition: A | Discharge status: Home / Self Care | Payer: Medicare Other | Source: Ambulatory Visit | Attending: Internal Medicine | Admitting: Internal Medicine

## 2021-09-22 ENCOUNTER — Other Ambulatory Visit: Payer: Self-pay

## 2021-09-22 DIAGNOSIS — Z1231 Encounter for screening mammogram for malignant neoplasm of breast: Secondary | ICD-10-CM

## 2022-02-01 ENCOUNTER — Ambulatory Visit
Admission: RE | Admit: 2022-02-01 | Discharge: 2022-02-01 | Disposition: A | Discharge status: Home / Self Care | Payer: Medicare Other | Source: Ambulatory Visit | Attending: Internal Medicine | Admitting: Internal Medicine

## 2022-02-01 DIAGNOSIS — M810 Age-related osteoporosis without current pathological fracture: Secondary | ICD-10-CM

## 2022-05-25 ENCOUNTER — Other Ambulatory Visit: Payer: Self-pay | Admitting: Internal Medicine

## 2022-05-25 DIAGNOSIS — Z1239 Encounter for other screening for malignant neoplasm of breast: Secondary | ICD-10-CM

## 2022-07-30 LAB — COLOGUARD

## 2022-07-30 LAB — EXTERNAL GENERIC LAB PROCEDURE

## 2022-09-27 ENCOUNTER — Ambulatory Visit
Admission: RE | Admit: 2022-09-27 | Discharge: 2022-09-27 | Disposition: A | Discharge status: Home / Self Care | Payer: Medicare Other | Source: Ambulatory Visit | Attending: Internal Medicine | Admitting: Internal Medicine

## 2022-09-27 DIAGNOSIS — Z1239 Encounter for other screening for malignant neoplasm of breast: Secondary | ICD-10-CM

## 2022-10-04 ENCOUNTER — Other Ambulatory Visit: Payer: Self-pay | Admitting: Internal Medicine

## 2022-10-05 LAB — LIPID PANEL
Cholesterol: 207 mg/dL — ABNORMAL HIGH (ref <200)
HDL: 63 mg/dL (ref >=50)
LDL Cholesterol (Calc): 122 mg/dL (calc) — ABNORMAL HIGH
Non-HDL Cholesterol (Calc): 144 mg/dL (calc) — ABNORMAL HIGH (ref <130)
Total CHOL/HDL Ratio: 3.3 (calc) (ref <5.0)
Triglycerides: 111 mg/dL (ref <150)

## 2022-10-05 LAB — EXTRA LAV TOP TUBE

## 2022-12-24 LAB — EXTERNAL GENERIC LAB PROCEDURE

## 2022-12-24 LAB — COLOGUARD

## 2023-01-14 ENCOUNTER — Other Ambulatory Visit: Payer: Self-pay | Admitting: Internal Medicine

## 2023-01-15 LAB — COMPLETE METABOLIC PANEL WITH GFR
AG Ratio: 1.2 (calc) (ref 1.0–2.5)
ALT: 16 U/L (ref 6–29)
AST: 18 U/L (ref 10–35)
Albumin: 3.9 g/dL (ref 3.6–5.1)
Alkaline phosphatase (APISO): 58 U/L (ref 37–153)
BUN: 18 mg/dL (ref 7–25)
CO2: 22 mmol/L (ref 20–32)
Calcium: 8.8 mg/dL (ref 8.6–10.4)
Chloride: 106 mmol/L (ref 98–110)
Creat: 0.82 mg/dL (ref 0.50–1.05)
Globulin: 3.2 g/dL (calc) (ref 1.9–3.7)
Glucose, Bld: 68 mg/dL (ref 65–99)
Potassium: 3.9 mmol/L (ref 3.5–5.3)
Sodium: 139 mmol/L (ref 135–146)
Total Bilirubin: 0.8 mg/dL (ref 0.2–1.2)
Total Protein: 7.1 g/dL (ref 6.1–8.1)
eGFR: 79 mL/min/{1.73_m2} (ref >=60)

## 2023-01-15 LAB — LIPID PANEL
Cholesterol: 159 mg/dL (ref <200)
HDL: 66 mg/dL (ref >=50)
LDL Cholesterol (Calc): 78 mg/dL (calc)
Non-HDL Cholesterol (Calc): 93 mg/dL (calc) (ref <130)
Total CHOL/HDL Ratio: 2.4 (calc) (ref <5.0)
Triglycerides: 72 mg/dL (ref <150)

## 2023-01-15 LAB — CBC
HCT: 37.8 % (ref 35.0–45.0)
Hemoglobin: 11.4 g/dL — ABNORMAL LOW (ref 11.7–15.5)
MCH: 24.6 pg — ABNORMAL LOW (ref 27.0–33.0)
MCHC: 30.2 g/dL — ABNORMAL LOW (ref 32.0–36.0)
MCV: 81.5 fL (ref 80.0–100.0)
MPV: 11.3 fL (ref 7.5–12.5)
Platelets: 177 10*3/uL (ref 140–400)
RBC: 4.64 10*6/uL (ref 3.80–5.10)
RDW: 15.4 % — ABNORMAL HIGH (ref 11.0–15.0)
WBC: 6.3 10*3/uL (ref 3.8–10.8)

## 2023-01-15 LAB — TSH: TSH: 0.86 mIU/L (ref 0.40–4.50)

## 2023-01-15 LAB — VITAMIN D 25 HYDROXY (VIT D DEFICIENCY, FRACTURES): Vit D, 25-Hydroxy: 24 ng/mL — ABNORMAL LOW (ref 30–100)

## 2023-06-06 ENCOUNTER — Other Ambulatory Visit (HOSPITAL_COMMUNITY): Payer: Self-pay

## 2023-07-26 ENCOUNTER — Other Ambulatory Visit: Payer: Self-pay | Admitting: Internal Medicine

## 2023-07-26 DIAGNOSIS — Z1231 Encounter for screening mammogram for malignant neoplasm of breast: Secondary | ICD-10-CM

## 2023-09-28 ENCOUNTER — Ambulatory Visit
Admission: RE | Admit: 2023-09-28 | Discharge: 2023-09-28 | Disposition: A | Discharge status: Home / Self Care | Payer: Medicare Other | Source: Ambulatory Visit | Attending: Internal Medicine | Admitting: Internal Medicine

## 2023-09-28 DIAGNOSIS — Z1231 Encounter for screening mammogram for malignant neoplasm of breast: Secondary | ICD-10-CM

## 2023-11-15 ENCOUNTER — Other Ambulatory Visit: Payer: Self-pay | Admitting: Internal Medicine

## 2023-11-15 DIAGNOSIS — E2839 Other primary ovarian failure: Secondary | ICD-10-CM

## 2023-12-07 ENCOUNTER — Encounter: Payer: Self-pay | Admitting: Internal Medicine

## 2023-12-26 LAB — COLOGUARD: COLOGUARD: NEGATIVE

## 2023-12-27 DIAGNOSIS — M5416 Radiculopathy, lumbar region: Secondary | ICD-10-CM | POA: Insufficient documentation

## 2023-12-27 DIAGNOSIS — Z79899 Other long term (current) drug therapy: Secondary | ICD-10-CM | POA: Insufficient documentation

## 2023-12-27 DIAGNOSIS — M2550 Pain in unspecified joint: Secondary | ICD-10-CM | POA: Insufficient documentation

## 2023-12-27 DIAGNOSIS — G9332 Myalgic encephalomyelitis/chronic fatigue syndrome: Secondary | ICD-10-CM | POA: Insufficient documentation

## 2023-12-27 DIAGNOSIS — G8929 Other chronic pain: Secondary | ICD-10-CM | POA: Insufficient documentation

## 2024-01-02 ENCOUNTER — Encounter: Payer: Self-pay | Admitting: Gastroenterology

## 2024-01-10 ENCOUNTER — Encounter

## 2024-01-23 ENCOUNTER — Encounter: Admitting: Gastroenterology

## 2024-01-25 ENCOUNTER — Ambulatory Visit (AMBULATORY_SURGERY_CENTER)

## 2024-01-25 VITALS — Ht <= 58 in | Wt 108.8 lb

## 2024-01-25 DIAGNOSIS — Z1211 Encounter for screening for malignant neoplasm of colon: Secondary | ICD-10-CM

## 2024-01-25 MED ORDER — NA SULFATE-K SULFATE-MG SULF 17.5-3.13-1.6 GM/177ML PO SOLN: 1.0000 | Freq: Once | ORAL | 0 refills | Status: AC

## 2024-01-25 NOTE — Progress Notes (Signed)
 No egg or soy allergy known to patient  No issues known to pt with past sedation with any surgeries or procedures Patient denies ever being told they had issues or difficulty with intubation  No FH of Malignant Hyperthermia Pt is not on diet pills Pt is not on  home 02  Pt is not on blood thinners  Constipation: yes  No A fib or A flutter Have any cardiac testing pending-- no  LOA: independent  Prep: suprep   Patient's chart reviewed by Norleen Schillings CNRA prior to previsit and patient appropriate for the [facility].  Previsit completed and red dot placed by patient's name on their procedure day (on provider's schedule).     PV completed with patient. Prep instructions sent at [facility] apt

## 2024-01-31 ENCOUNTER — Encounter: Admitting: Gastroenterology

## 2024-02-03 ENCOUNTER — Encounter: Payer: Self-pay | Admitting: Gastroenterology

## 2024-02-24 ENCOUNTER — Encounter: Payer: Self-pay | Admitting: Gastroenterology

## 2024-02-24 ENCOUNTER — Ambulatory Visit: Admitting: Gastroenterology

## 2024-02-24 VITALS — BP 137/81 | HR 57 | Temp 97.3°F | Resp 21 | Ht <= 58 in | Wt 108.0 lb

## 2024-02-24 DIAGNOSIS — Z1211 Encounter for screening for malignant neoplasm of colon: Secondary | ICD-10-CM | POA: Diagnosis not present

## 2024-02-24 MED ORDER — SODIUM CHLORIDE 0.9 % IV SOLN: 500.0000 mL | Freq: Once | INTRAVENOUS | Status: DC

## 2024-02-24 NOTE — Progress Notes (Signed)
 GASTROENTEROLOGY PROCEDURE H&P NOTE   Primary Care Physician: Shelda Atlas, MD    Reason for Procedure:  Colon Cancer screening  Plan:    Colonoscopy  Patient is appropriate for endoscopic procedure(s) in the ambulatory ([facility]) setting.  The nature of the procedure, as well as the risks, benefits, and alternatives were carefully and thoroughly reviewed with the patient. Ample time for discussion and questions allowed. The patient understood, was satisfied, and agreed to proceed.     HPI: Mandy Williams is a 68 y.o. female who presents for colonoscopy for routine Colon Cancer screening.  No active GI symptoms.  No known family history of colon cancer or related malignancy.  Patient is otherwise without complaints or active issues today.  Past Medical History:  Diagnosis Date   Arthritis     Past Surgical History:  Procedure Laterality Date   NO PAST SURGERIES      Prior to Admission medications   Medication Sig Start Date End Date Taking? Authorizing Provider  calcium carbonate (OSCAL) 1500 (600 Ca) MG TABS tablet Take by mouth 2 (two) times daily with a meal.   Yes [provider]  Etanercept (ENBREL [place]) Inject into the skin. Patient taking differently: Inject 50 mLs into the skin once a week.   Yes [provider]  folic acid (FOLVITE) 1 MG tablet Take 1 mg by mouth daily.   Yes [provider]  methotrexate (RHEUMATREX) 2.5 MG tablet TK 6 TS PO WEEKLY ON THE SAME DAY 07/15/17  Yes [provider]  rosuvastatin (CRESTOR) 10 MG tablet Take 10 mg by mouth daily.   Yes [provider]  alendronate (FOSAMAX) 70 MG tablet TK 1 T PO Q WEEK Patient not taking: Reported on 01/25/2024 06/22/17   [provider]  famotidine  (PEPCID ) 20 MG tablet Take 1 tablet (20 mg total) by mouth at bedtime. Patient not taking: Reported on 09/09/2021 07/27/17   Gretta Ozell CROME, PA-C  ferrous sulfate 325 (65 FE) MG tablet Take 325 mg by mouth daily  with breakfast.    [provider]  meloxicam  (MOBIC ) 7.5 MG tablet TK 1 T PO BID WF 07/21/17   [provider]    Current Outpatient Medications  Medication Sig Dispense Refill   calcium carbonate (OSCAL) 1500 (600 Ca) MG TABS tablet Take by mouth 2 (two) times daily with a meal.     Etanercept (ENBREL [place]) Inject into the skin. (Patient taking differently: Inject 50 mLs into the skin once a week.)     folic acid (FOLVITE) 1 MG tablet Take 1 mg by mouth daily.     methotrexate (RHEUMATREX) 2.5 MG tablet TK 6 TS PO WEEKLY ON THE SAME DAY  2   rosuvastatin (CRESTOR) 10 MG tablet Take 10 mg by mouth daily.     alendronate (FOSAMAX) 70 MG tablet TK 1 T PO Q WEEK (Patient not taking: Reported on 01/25/2024)  4   famotidine  (PEPCID ) 20 MG tablet Take 1 tablet (20 mg total) by mouth at bedtime. (Patient not taking: Reported on 09/09/2021) 30 tablet 1   ferrous sulfate 325 (65 FE) MG tablet Take 325 mg by mouth daily with breakfast.     meloxicam  (MOBIC ) 7.5 MG tablet TK 1 T PO BID WF  2   Current Facility-Administered Medications  Medication Dose Route Frequency Provider Last Rate Last Admin   0.9 %  sodium chloride  infusion  500 mL Intravenous Once Khalen Styer V, DO  Allergies as of 02/24/2024   (No Known Allergies)    Family History  Problem Relation Age of Onset   Asthma Daughter    Colon cancer Neg Hx    Rectal cancer Neg Hx    Stomach cancer Neg Hx     Social History   Socioeconomic History   Marital status: Married    Spouse name: Georgianna Buggy   Number of children: 2   Years of education: none   Highest education level: Not on file  Occupational History   Occupation: sock pairing    Employer: [employer]    Comment: can make 500-600 pairs/10 hour day  Tobacco Use   Smoking status: Never   Smokeless tobacco: Never  Vaping Use   Vaping status: Never Used  Substance and Sexual Activity   Alcohol use: No   Drug use: No   Sexual activity:  Never    Birth control/protection: Post-menopausal  Other Topics Concern   Not on file  Social History Narrative   ** Merged History Encounter **       ** Merged History Encounter **       From [place].  Came to the [place]  1992.   Lives with her husband, their children, and her daughter's husband and their son.       Social Drivers of Corporate investment banker Strain: Not on file  Food Insecurity: Not on file  Transportation Needs: Not on file  Physical Activity: Not on file  Stress: Not on file  Social Connections: Not on file  Intimate Partner Violence: Not on file    Physical Exam: Vital signs in last 24 hours: @BP  (!) 161/93   Pulse 64   Temp (!) 97.3 F (36.3 C)   Ht 4' 7 (1.397 m)   Wt 108 lb (49 kg)   SpO2 98%   BMI 25.10 kg/m  GEN: NAD EYE: Sclerae anicteric ENT: MMM CV: Non-tachycardic Pulm: CTA b/l GI: Soft, NT/ND NEURO:  Alert & Oriented x 3   Sandor Flatter, DO [facility]   02/24/2024 7:52 AM

## 2024-02-24 NOTE — Op Note (Signed)
 [facility] Patient Name: Mandy Williams Procedure Date: 02/24/2024 7:14 AM MRN: [id number] Endoscopist: Sandor Flatter , MD, [id number] Age: 68 Referring MD:  Date of Birth: Oct 17, 1955 Gender: Female Account #: [id number] Procedure:                Colonoscopy Indications:              Screening for colorectal malignant neoplasm, This                            is the patient's first colonoscopy Medicines:                Monitored Anesthesia Care Procedure:                Pre-Anesthesia Assessment:                           - Prior to the procedure, a History and Physical                            was performed, and patient medications and                            allergies were reviewed. The patient's tolerance of                            previous anesthesia was also reviewed. The risks                            and benefits of the procedure and the sedation                            options and risks were discussed with the patient.                            All questions were answered, and informed consent                            was obtained. Prior Anticoagulants: The patient has                            taken no anticoagulant or antiplatelet agents. ASA                            Grade Assessment: II - A patient with mild systemic                            disease. After reviewing the risks and benefits,                            the patient was deemed in satisfactory condition to                            undergo the procedure.  After obtaining informed consent, the colonoscope                            was passed under direct vision. Throughout the                            procedure, the patient's blood pressure, pulse, and                            oxygen saturations were monitored continuously. The                            PCF-HQ190L Colonoscope [id number] was introduced                            through the anus and advanced  to the the cecum,                            identified by appendiceal orifice and ileocecal                            valve. The colonoscopy was performed without                            difficulty. The patient tolerated the procedure                            well. The quality of the bowel preparation was                            good. The ileocecal valve, appendiceal orifice, and                            rectum were photographed. Scope In: 8:06:23 AM Scope Out: 8:19:27 AM Scope Withdrawal Time: 0 hours 8 minutes 42 seconds  Total Procedure Duration: 0 hours 13 minutes 4 seconds  Findings:                 The perianal and digital rectal examinations were                            normal.                           The entire colon appeared normal.                           The retroflexed view of the distal rectum and anal                            verge was normal and showed no anal or rectal                            abnormalities. Complications:            No immediate complications. Estimated  Blood Loss:     Estimated blood loss: none. Impression:               - The entire examined colon is normal.                           - The distal rectum and anal verge are normal on                            retroflexion view.                           - No specimens collected. Recommendation:           - Patient has a contact number available for                            emergencies. The signs and symptoms of potential                            delayed complications were discussed with the                            patient. Return to normal activities tomorrow.                            Written discharge instructions were provided to the                            patient.                           - Resume previous diet.                           - Continue present medications.                           - Repeat colonoscopy in 10 years for screening                             purposes.                           - Return to GI office PRN. Sandor Flatter, MD 02/24/2024 8:24:25 AM

## 2024-02-24 NOTE — Patient Instructions (Signed)
 Discharge instructions instructions given. Normal exam. Resume previous medications. YOU HAD AN ENDOSCOPIC PROCEDURE TODAY AT THE [facility]:   Refer to the procedure report that was given to you for any specific questions about what was found during the examination.  If the procedure report does not answer your questions, please call your gastroenterologist to clarify.  If you requested that your care partner not be given the details of your procedure findings, then the procedure report has been included in a sealed envelope for you to review at your convenience later.  YOU SHOULD EXPECT: Some feelings of bloating in the abdomen. Passage of more gas than usual.  Walking can help get rid of the air that was put into your GI tract during the procedure and reduce the bloating. If you had a lower endoscopy (such as a colonoscopy or flexible sigmoidoscopy) you may notice spotting of blood in your stool or on the toilet paper. If you underwent a bowel prep for your procedure, you may not have a normal bowel movement for a few days.  Please Note:  You might notice some irritation and congestion in your nose or some drainage.  This is from the oxygen used during your procedure.  There is no need for concern and it should clear up in a day or so.  SYMPTOMS TO REPORT IMMEDIATELY:  Following lower endoscopy (colonoscopy or flexible sigmoidoscopy):  Excessive amounts of blood in the stool  Significant tenderness or worsening of abdominal pains  Swelling of the abdomen that is new, acute  Fever of 100F or higher   For urgent or emergent issues, a gastroenterologist can be reached at any hour by calling [phone]. Do not use MyChart messaging for urgent concerns.    DIET:  We do recommend a small meal at first, but then you may proceed to your regular diet.  Drink plenty of fluids but you should avoid alcoholic beverages for 24 hours.  ACTIVITY:  You should plan to take it easy for the  rest of today and you should NOT DRIVE or use heavy machinery until tomorrow (because of the sedation medicines used during the test).    FOLLOW UP: Our staff will call the number listed on your records the next business day following your procedure.  We will call around 7:15- 8:00 am to check on you and address any questions or concerns that you may have regarding the information given to you following your procedure. If we do not reach you, we will leave a message.     If any biopsies were taken you will be contacted by phone or by letter within the next 1-3 weeks.  Please call us  at [phone] if you have not heard about the biopsies in 3 weeks.    SIGNATURES/CONFIDENTIALITY: You and/or your care partner have signed paperwork which will be entered into your electronic medical record.  These signatures attest to the fact that that the information above on your After Visit Summary has been reviewed and is understood.  Full responsibility of the confidentiality of this discharge information lies with you and/or your care-partner.

## 2024-02-24 NOTE — Progress Notes (Signed)
 Pt's states no medical or surgical changes since previsit or office visit.  [place] Interpreter with [employer] assisted with interview today.

## 2024-02-24 NOTE — Progress Notes (Signed)
 Vss nad trans to pacu

## 2024-02-27 ENCOUNTER — Telehealth: Payer: Self-pay

## 2024-02-27 NOTE — Telephone Encounter (Signed)
  Follow up Call-     02/24/2024    7:28 AM  Call back number  Post procedure Call Back phone  # [phone]  Permission to leave phone message Yes     Patient questions:  Do you have a fever, pain , or abdominal swelling? No. Pain Score  0 *  Have you tolerated food without any problems? Yes.    Have you been able to return to your normal activities? Yes.    Do you have any questions about your discharge instructions: Diet   No. Medications  No. Follow up visit  No.  Do you have questions or concerns about your Care? No.  Actions: * If pain score is 4 or above: No action needed, pain <4.

## 2024-06-07 ENCOUNTER — Ambulatory Visit (HOSPITAL_BASED_OUTPATIENT_CLINIC_OR_DEPARTMENT_OTHER)
Admission: RE | Admit: 2024-06-07 | Discharge: 2024-06-07 | Disposition: A | Discharge status: Home / Self Care | Source: Ambulatory Visit | Attending: Internal Medicine | Admitting: Internal Medicine

## 2024-06-07 ENCOUNTER — Other Ambulatory Visit (HOSPITAL_BASED_OUTPATIENT_CLINIC_OR_DEPARTMENT_OTHER): Payer: Self-pay

## 2024-06-07 DIAGNOSIS — E2839 Other primary ovarian failure: Secondary | ICD-10-CM | POA: Insufficient documentation

## 2024-06-07 MED ORDER — COMIRNATY 30 MCG/0.3ML IM SUSY
0.3000 mL | PREFILLED_SYRINGE | Freq: Once | INTRAMUSCULAR | 0 refills | Status: AC
  Filled 2024-06-07: qty 0.3, 1d supply, fill #0

## 2024-08-03 ENCOUNTER — Other Ambulatory Visit

## 2024-08-14 ENCOUNTER — Other Ambulatory Visit: Payer: Self-pay | Admitting: Internal Medicine

## 2024-08-14 DIAGNOSIS — Z1231 Encounter for screening mammogram for malignant neoplasm of breast: Secondary | ICD-10-CM

## 2024-10-01 ENCOUNTER — Ambulatory Visit
